# Patient Record
Sex: Female | Born: 1975 | Race: White | Hispanic: No | Marital: Married | Smoking: Former smoker
Health system: Southern US, Community
[De-identification: ages and names within clinical notes are randomized; demographics above are authoritative.]

## PROBLEM LIST (undated history)

## (undated) DIAGNOSIS — R112 Nausea with vomiting, unspecified: Secondary | ICD-10-CM

## (undated) DIAGNOSIS — N200 Calculus of kidney: Secondary | ICD-10-CM

## (undated) DIAGNOSIS — R51 Headache: Secondary | ICD-10-CM

## (undated) DIAGNOSIS — I00 Rheumatic fever without heart involvement: Secondary | ICD-10-CM

## (undated) DIAGNOSIS — Z9889 Other specified postprocedural states: Secondary | ICD-10-CM

## (undated) DIAGNOSIS — G43909 Migraine, unspecified, not intractable, without status migrainosus: Secondary | ICD-10-CM

## (undated) DIAGNOSIS — E785 Hyperlipidemia, unspecified: Secondary | ICD-10-CM

## (undated) DIAGNOSIS — K219 Gastro-esophageal reflux disease without esophagitis: Secondary | ICD-10-CM

## (undated) HISTORY — DX: Calculus of kidney: N20.0

## (undated) HISTORY — PX: OOPHORECTOMY: SHX86

## (undated) HISTORY — DX: Rheumatic fever without heart involvement: I00

## (undated) HISTORY — DX: Gastro-esophageal reflux disease without esophagitis: K21.9

## (undated) HISTORY — PX: OTHER SURGICAL HISTORY: SHX169

## (undated) HISTORY — PX: TONSILLECTOMY: SUR1361

## (undated) HISTORY — PX: CHOLECYSTECTOMY: SHX55

## (undated) HISTORY — PX: ABDOMINAL HYSTERECTOMY: SHX81

## (undated) HISTORY — DX: Hyperlipidemia, unspecified: E78.5

## (undated) HISTORY — PX: COLON SURGERY: SHX602

## (undated) HISTORY — DX: Migraine, unspecified, not intractable, without status migrainosus: G43.909

---

## 1994-01-14 HISTORY — PX: APPENDECTOMY: SHX54

## 2003-12-23 ENCOUNTER — Ambulatory Visit: Payer: Self-pay | Admitting: Internal Medicine

## 2004-04-30 ENCOUNTER — Ambulatory Visit: Payer: Self-pay | Admitting: Internal Medicine

## 2004-05-07 ENCOUNTER — Ambulatory Visit: Payer: Self-pay | Admitting: Internal Medicine

## 2004-05-25 ENCOUNTER — Ambulatory Visit: Payer: Self-pay | Admitting: Internal Medicine

## 2004-07-13 ENCOUNTER — Ambulatory Visit: Payer: Self-pay | Admitting: Internal Medicine

## 2004-12-31 ENCOUNTER — Ambulatory Visit: Payer: Self-pay | Admitting: Internal Medicine

## 2005-01-01 ENCOUNTER — Ambulatory Visit: Payer: Self-pay | Admitting: Internal Medicine

## 2005-01-09 ENCOUNTER — Ambulatory Visit (HOSPITAL_COMMUNITY): Admission: RE | Admit: 2005-01-09 | Discharge: 2005-01-09 | Payer: Self-pay | Admitting: Internal Medicine

## 2005-01-15 ENCOUNTER — Ambulatory Visit: Payer: Self-pay | Admitting: Internal Medicine

## 2005-01-16 ENCOUNTER — Ambulatory Visit: Payer: Self-pay | Admitting: Cardiology

## 2005-01-18 ENCOUNTER — Encounter (INDEPENDENT_AMBULATORY_CARE_PROVIDER_SITE_OTHER): Payer: Self-pay | Admitting: *Deleted

## 2005-01-18 ENCOUNTER — Inpatient Hospital Stay (HOSPITAL_COMMUNITY): Admission: EM | Admit: 2005-01-18 | Discharge: 2005-01-20 | Payer: Self-pay | Admitting: Surgery

## 2005-06-27 ENCOUNTER — Ambulatory Visit: Payer: Self-pay | Admitting: Internal Medicine

## 2005-07-01 ENCOUNTER — Other Ambulatory Visit: Admission: RE | Admit: 2005-07-01 | Discharge: 2005-07-01 | Payer: Self-pay | Admitting: Internal Medicine

## 2005-07-01 ENCOUNTER — Ambulatory Visit: Payer: Self-pay | Admitting: Internal Medicine

## 2005-08-27 ENCOUNTER — Ambulatory Visit: Payer: Self-pay | Admitting: Internal Medicine

## 2005-09-24 ENCOUNTER — Ambulatory Visit: Payer: Self-pay | Admitting: Internal Medicine

## 2005-11-26 ENCOUNTER — Ambulatory Visit: Payer: Self-pay | Admitting: Internal Medicine

## 2005-11-27 LAB — CONVERTED CEMR LAB
Basophils Relative: 1 % (ref 0.0–1.0)
HCT: 44.9 % (ref 36.0–46.0)
Hemoglobin: 15 g/dL (ref 12.0–15.0)
Monocytes Absolute: 0.3 10*3/uL (ref 0.2–0.7)
Neutrophils Relative %: 60.8 % (ref 43.0–77.0)
RBC: 4.77 M/uL (ref 3.87–5.11)
RDW: 12.3 % (ref 11.5–14.6)

## 2005-12-26 ENCOUNTER — Ambulatory Visit: Payer: Self-pay | Admitting: Internal Medicine

## 2006-04-08 ENCOUNTER — Ambulatory Visit: Payer: Self-pay | Admitting: Internal Medicine

## 2006-04-08 LAB — CONVERTED CEMR LAB
ALT: 25 units/L (ref 0–40)
Basophils Absolute: 0 10*3/uL (ref 0.0–0.1)
Bilirubin, Direct: 0.1 mg/dL (ref 0.0–0.3)
Eosinophils Absolute: 0.2 10*3/uL (ref 0.0–0.6)
Eosinophils Relative: 4.4 % (ref 0.0–5.0)
HCT: 40.6 % (ref 36.0–46.0)
Lymphocytes Relative: 37.8 % (ref 12.0–46.0)
MCHC: 34.2 g/dL (ref 30.0–36.0)
MCV: 92 fL (ref 78.0–100.0)
Neutro Abs: 2.9 10*3/uL (ref 1.4–7.7)
Neutrophils Relative %: 51.2 % (ref 43.0–77.0)
Platelets: 230 10*3/uL (ref 150–400)
Potassium: 3.7 meq/L (ref 3.5–5.1)
RBC: 4.41 M/uL (ref 3.87–5.11)
TSH: 1.18 microintl units/mL (ref 0.35–5.50)
Total Protein: 7.2 g/dL (ref 6.0–8.3)

## 2006-04-09 ENCOUNTER — Encounter: Payer: Self-pay | Admitting: Internal Medicine

## 2006-04-09 LAB — CONVERTED CEMR LAB: Prealbumin: 27.5 mg/dL (ref 18.0–45.0)

## 2006-05-05 ENCOUNTER — Ambulatory Visit: Payer: Self-pay | Admitting: Internal Medicine

## 2006-10-09 DIAGNOSIS — E785 Hyperlipidemia, unspecified: Secondary | ICD-10-CM | POA: Insufficient documentation

## 2006-10-09 DIAGNOSIS — I00 Rheumatic fever without heart involvement: Secondary | ICD-10-CM

## 2006-10-09 DIAGNOSIS — K219 Gastro-esophageal reflux disease without esophagitis: Secondary | ICD-10-CM | POA: Insufficient documentation

## 2006-10-09 HISTORY — DX: Gastro-esophageal reflux disease without esophagitis: K21.9

## 2006-10-09 HISTORY — DX: Rheumatic fever without heart involvement: I00

## 2006-10-09 HISTORY — DX: Hyperlipidemia, unspecified: E78.5

## 2006-10-28 DIAGNOSIS — F411 Generalized anxiety disorder: Secondary | ICD-10-CM | POA: Insufficient documentation

## 2006-10-28 DIAGNOSIS — F329 Major depressive disorder, single episode, unspecified: Secondary | ICD-10-CM | POA: Insufficient documentation

## 2006-10-28 DIAGNOSIS — F5000 Anorexia nervosa, unspecified: Secondary | ICD-10-CM | POA: Insufficient documentation

## 2006-12-10 IMAGING — NM NM HEPATO W/GB/PHARM/[PERSON_NAME]
2 series · 12 of 12 positions shown · non-contrast
Comparison: none

CLINICAL DATA: Abdominal pain and intolerance to fatty foods for six months.
 NUCLEAR MEDICINE HEPATOBILIARY SCAN WITH EJECTION FRACTION CALCULATION OF THE GALLBLADDER:
TECHNIQUE: Sequential abdominal images were obtained for approximately 60 minutes following intravenous injection of radiopharmaceutical.
 Radiopharmaceutical:  5 mCi Ac-QQm Choletec and 1.27 mcg of cholecystokinin.

[Series 1: he hepatobiliary · 3.21mm/px · 6 of 32 frames shown (1 of 2)]
[frame 3/32]
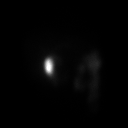
[frame 8/32]
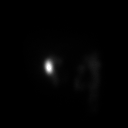
[frame 14/32]
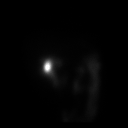
[frame 19/32]
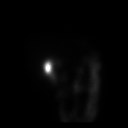
[frame 24/32]
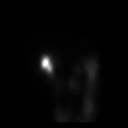
[frame 30/32]
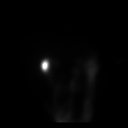

[Series 1: he hepatobiliary · 3.21mm/px · 6 of 60 frames shown (2 of 2)]
[frame 6/60]
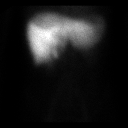
[frame 16/60]
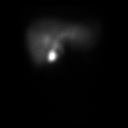
[frame 26/60]
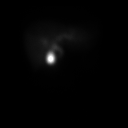
[frame 36/60]
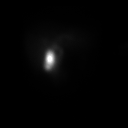
[frame 46/60]
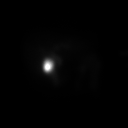
[frame 56/60]
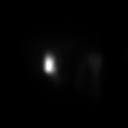

[12 of 12 positions shown; findings below may reference images not displayed]

FINDINGS: Uptake of radiopharmaceutical from the blood pool appears normal.  Gallbladder activity is visible at 8 minutes, and bowel activity is shown by 34 minutes. 
 Following administration of cholecystokinin, gallbladder ejection at 30 minutes is 63% which is within normal limits.
IMPRESSION: Normal hepatobiliary scan with normal gallbladder ejection demonstrated.

## 2006-12-17 IMAGING — CT CT ABDOMEN W/ CM
2 of 7 series · 14 of 46 positions shown, 18 images · IV contrast (APPLIED)
Comparison: Abdomen ultrasound report from [HOSPITAL], dated
01/01/2005. Nuclear medicine hepatobiliary scan dated 01/09/2005.

ABDOMEN CT WITH CONTRAST

<!--  IDXRADR:ADDEND:BEGIN -->Addendum Begins
<!--  IDXRADR:ADDEND:INNER_BEGIN -->Addendum:

In the clinical data, the partial colectomy was done for adhesions, not
effusions.
<!--  IDXRADR:ADDEND:INNER_END -->Addendum Ends
<!--  IDXRADR:ADDEND:END -->Clinical Data: Right upper quadrant abdominal pain radiating to the back for the
past 6 weeks, worse for the past 2 weeks. Nausea. Diarrhea. Status post total
abdominal hysterectomy, appendectomy and partial colectomy for effusions.
TECHNIQUE: Multidetector CT imaging of the abdomen and pelvis was performed
following the standard protocol during bolus administration of intravenous
contrast.
Contrast:  100 cc Omnipaque 300

[Series 2: abd_pel 5.0 b30f st · axial · 0.65mm/px · z∈[-436,-86]mm · 11 of 84 slices shown, 15 images]
[im 9/84  soft-tissue]
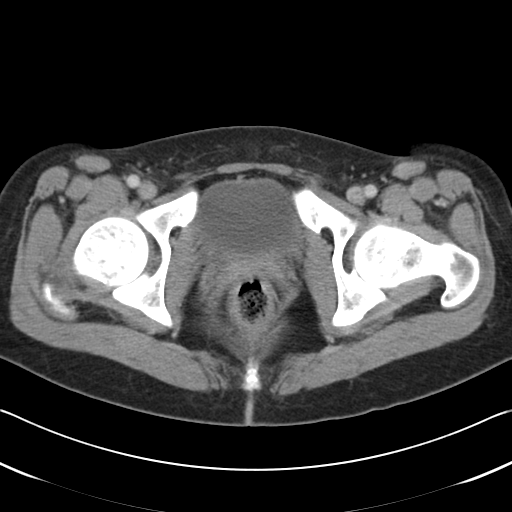
[im 9/84  bone]
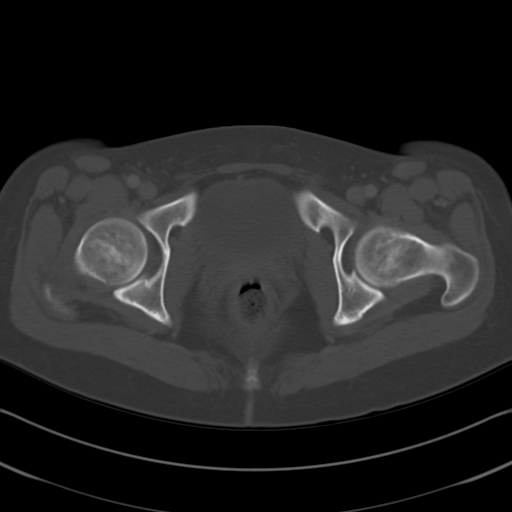
[im 18/84  soft-tissue]
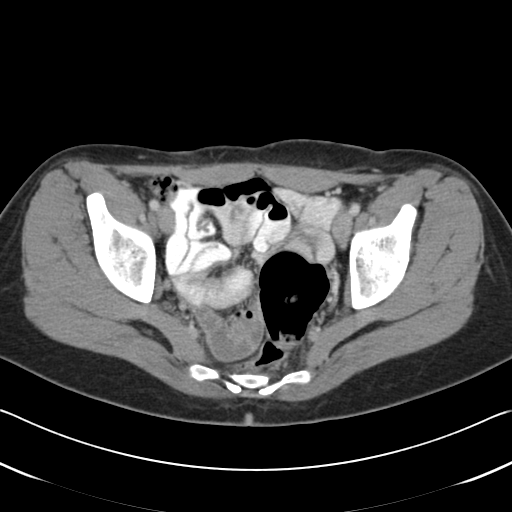
[im 27/84  soft-tissue]
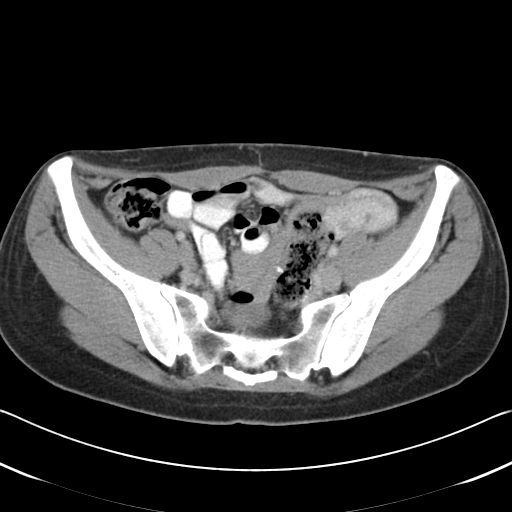
[im 35/84  soft-tissue]
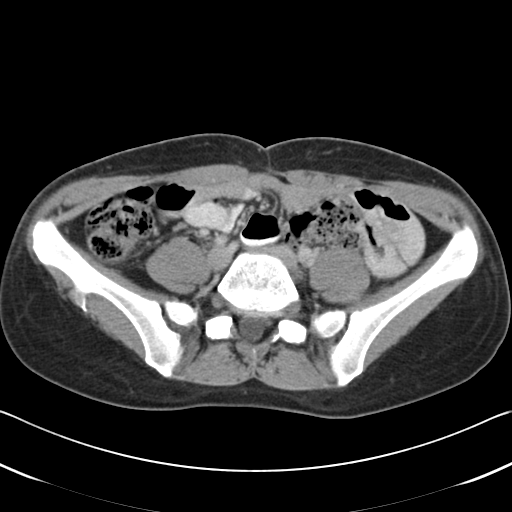
[im 44/84  soft-tissue]
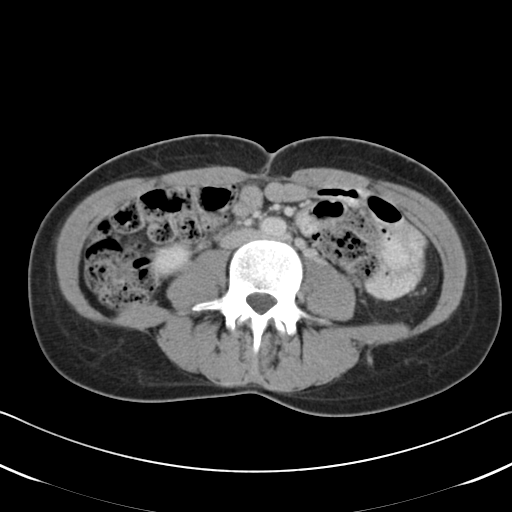
[im 53/84  soft-tissue]
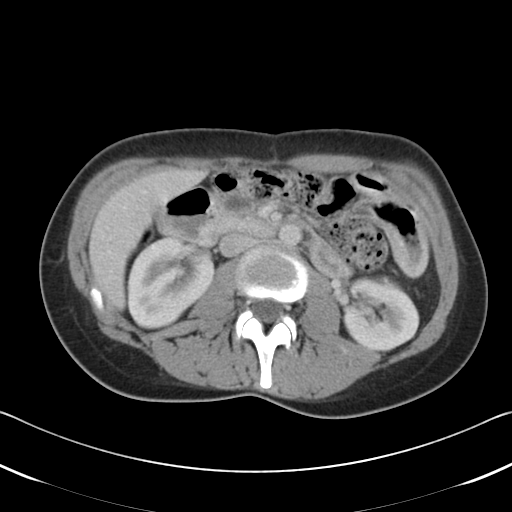
[im 62/84  soft-tissue]
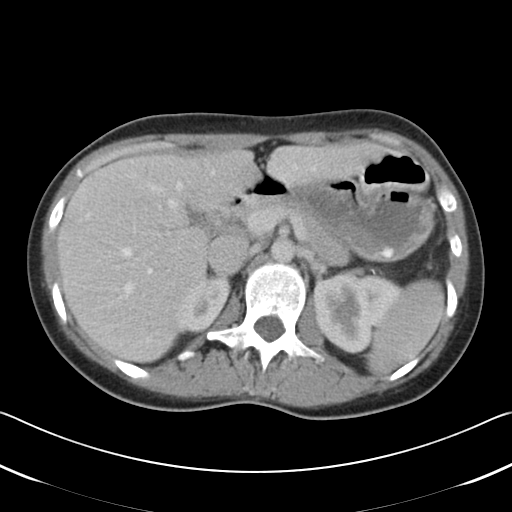
[im 66/84  lung]
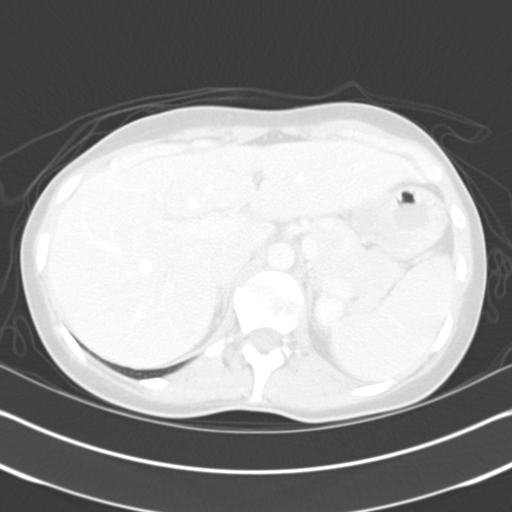
[im 70/84  soft-tissue]
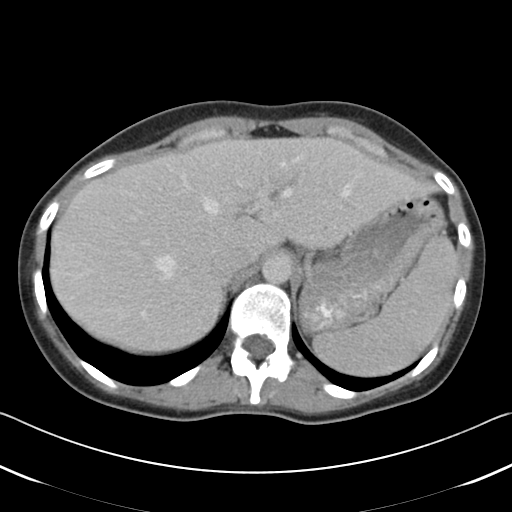
[im 70/84  lung]
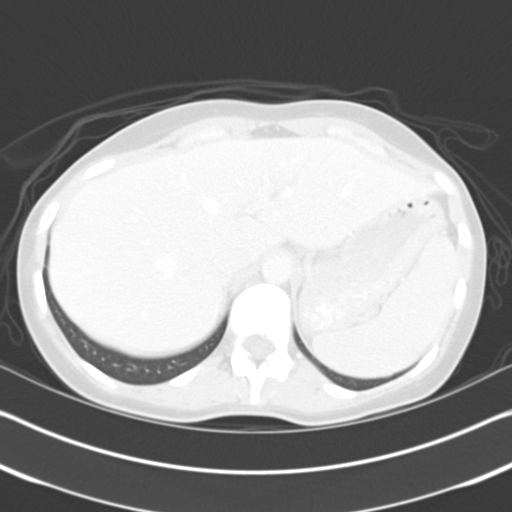
[im 75/84  lung]
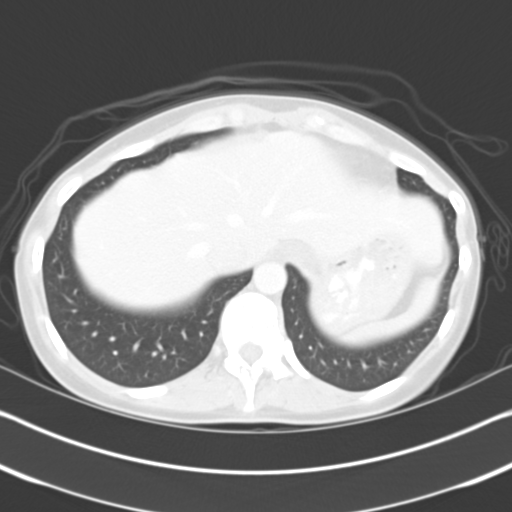
[im 79/84  soft-tissue]
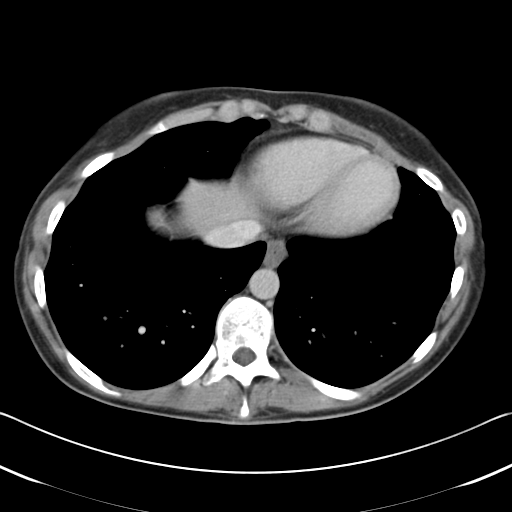
[im 79/84  lung]
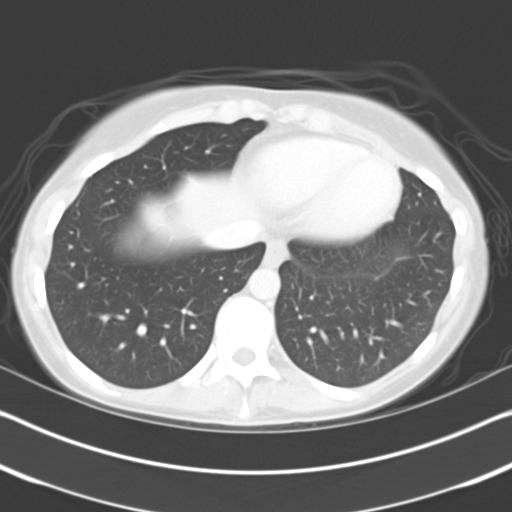
[im 79/84  bone]
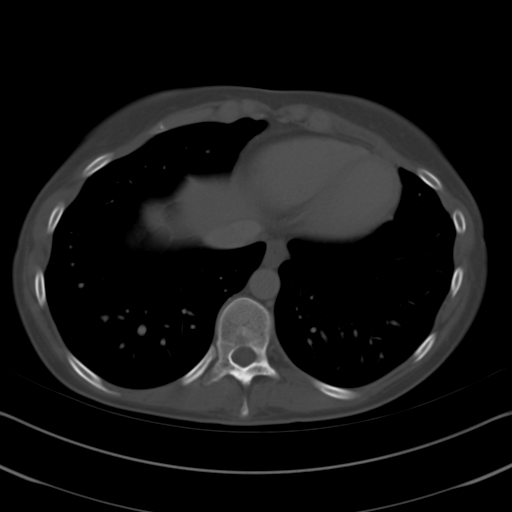

[Series 602: <mpr range> · coronal · 0.85mm/px · 3 of 39 slices shown]
[im 10/39  soft-tissue]
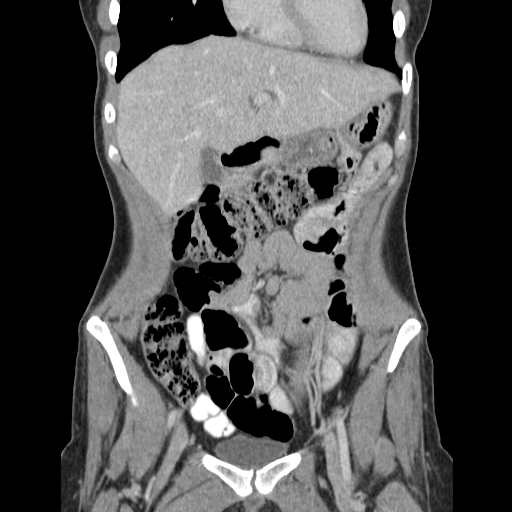
[im 20/39  soft-tissue]
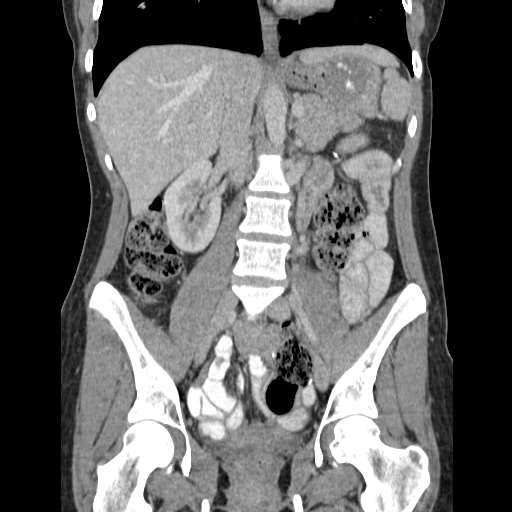
[im 29/39  soft-tissue]
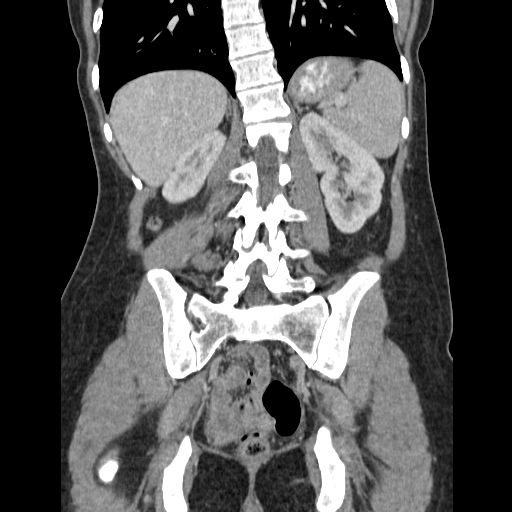

[14 of 46 positions shown; findings below may reference images not displayed]

FINDINGS: Mild diffuse gallbladder wall thickening, not seen at the time of the
previous ultrasound, by report. Suggestion of multiple small gallstones in the
neck of the gallbladder. No pericholecystic fluid seen. No biliary ductal
dilatation. Probable tiny cyst in the right lobe of the liver on image #16.
Unremarkable spleen, pancreas, kidneys and adrenal glands. Left upper quadrant
abdominal surgical clips, anterior and lateral to the left kidney. This is in
the region of the expected position of the splenic fracture of the colon. The
colon is located more inferiorly. There are additional surgical clips adjacent
to the left psoas muscle. No small bowel abnormalities or enlarged lymph nodes.
Clear lung bases. Mild to moderate levoconvex thoracolumbar scoliosis.

IMPRESSION

1. Mild diffuse gallbladder wall thickening. This may be an indication of
chronic or acute cholecystitis.

2. Suggestion of multiple small gallstones in the gallbladder. Since no stones
were seen in the gallbladder on the recent abdomen ultrasound, this most likely
represents volume averaging with the adjacent unopacified second portion of the
duodenum. There was no evidence of biliary obstruction on the recent
hepatobiliary scan.

PELVIS CT WITH CONTRAST
FINDINGS: Surgically absent uterus, ovaries and appendix. Surgical staple line
in sigmoid colon region, compatible with the location of previous partial
colectomy. No free peritoneal fluid or air. No enlarged lymph nodes. 2.3 cm
exostosis arising from the posterior aspect of the right iliac bone. No
overlying cartilage cap visualized.

IMPRESSION

No acute pelvic abnormality.

## 2006-12-20 IMAGING — RF DG CHOLANGIOGRAM OPERATIVE
1 series · 4 of 4 positions shown · non-contrast
Comparison: none

CLINICAL DATA: Cholelithiasis

[Series 1: run · 4 of 66 frames shown]
[frame 10/66]
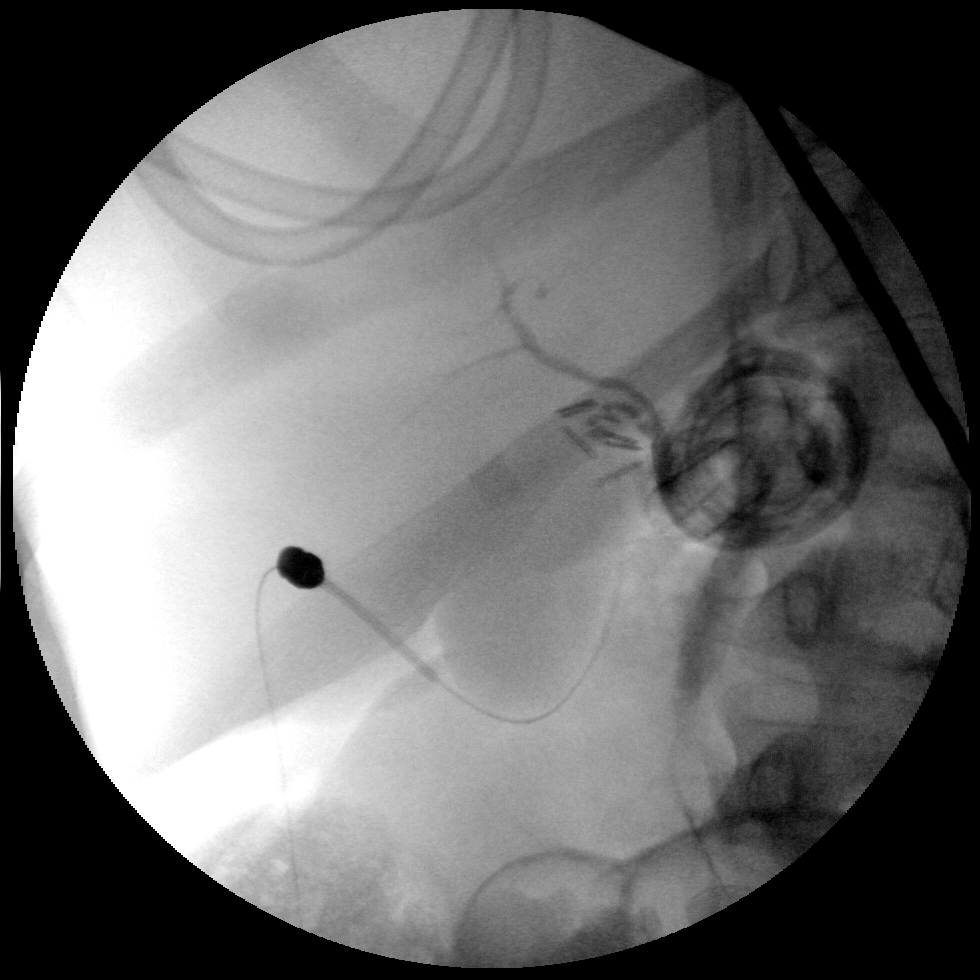
[frame 34/66]
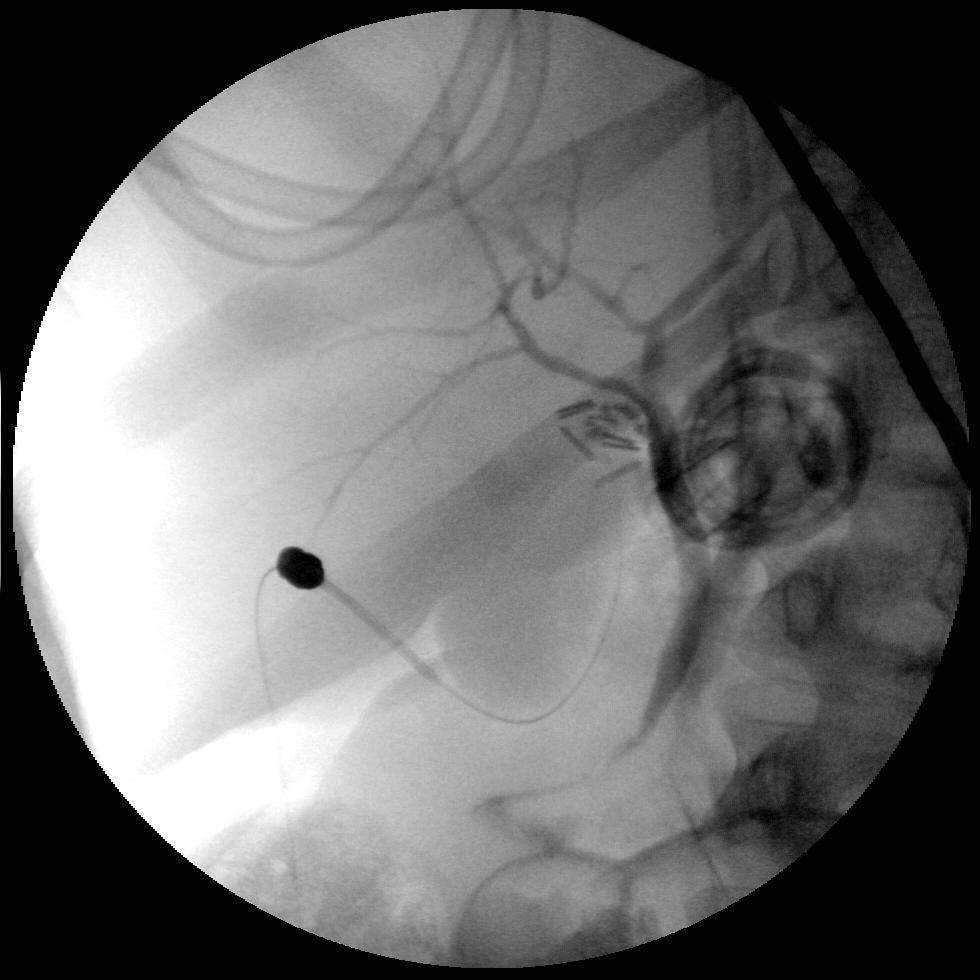
[frame 57/66]
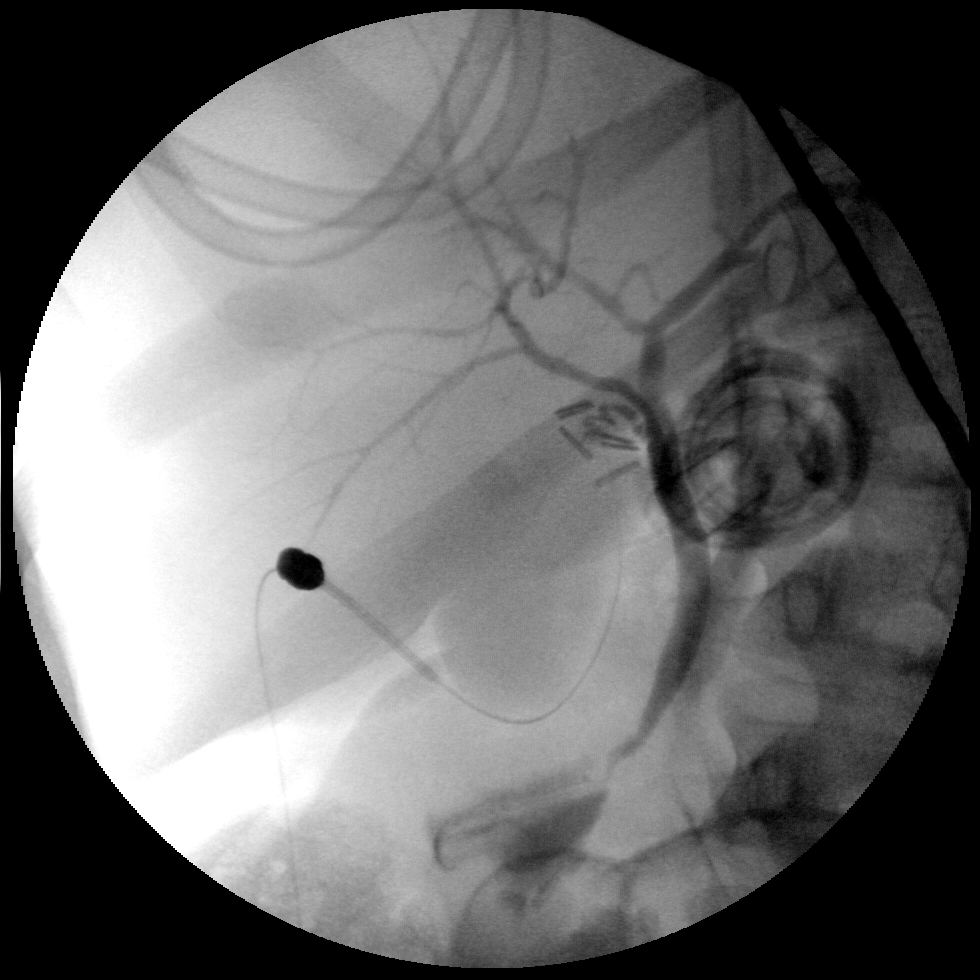
[frame 61/66]
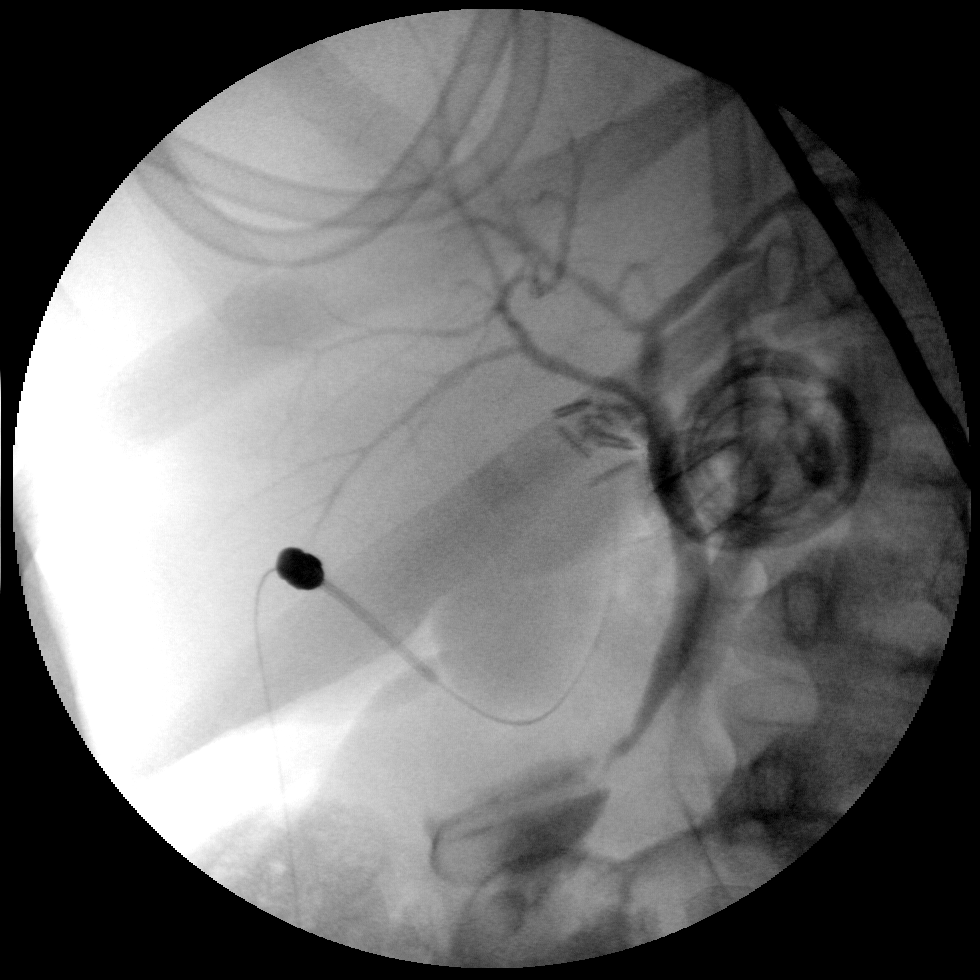

[4 of 4 positions shown; findings below may reference images not displayed]

INTRAOPERATIVE CHOLANGIOGRAM:

66  images from intraoperative C-arm fluoroscopy demonstrate  opacification of
the common bile duct. No filling defects to suggest retained stones. There is
incomplete evaluation of intrahepatic biliary tree, which appears decompressed
centrally. Contrast appears to flow on into decompressed duodenum.
IMPRESSION: 1. Negative for retained common duct stone

## 2008-02-12 ENCOUNTER — Encounter: Payer: Self-pay | Admitting: Internal Medicine

## 2008-02-17 ENCOUNTER — Encounter: Payer: Self-pay | Admitting: Internal Medicine

## 2009-11-27 ENCOUNTER — Ambulatory Visit: Payer: Self-pay | Admitting: Family Medicine

## 2009-11-27 DIAGNOSIS — E860 Dehydration: Secondary | ICD-10-CM | POA: Insufficient documentation

## 2009-11-27 DIAGNOSIS — G43909 Migraine, unspecified, not intractable, without status migrainosus: Secondary | ICD-10-CM

## 2009-11-27 DIAGNOSIS — R112 Nausea with vomiting, unspecified: Secondary | ICD-10-CM | POA: Insufficient documentation

## 2009-11-27 HISTORY — DX: Migraine, unspecified, not intractable, without status migrainosus: G43.909

## 2009-12-02 ENCOUNTER — Encounter: Payer: Self-pay | Admitting: Family Medicine

## 2009-12-04 ENCOUNTER — Telehealth: Payer: Self-pay | Admitting: Internal Medicine

## 2009-12-05 ENCOUNTER — Telehealth: Payer: Self-pay | Admitting: Family Medicine

## 2009-12-05 ENCOUNTER — Ambulatory Visit: Payer: Self-pay | Admitting: Family Medicine

## 2009-12-05 DIAGNOSIS — R5383 Other fatigue: Secondary | ICD-10-CM | POA: Insufficient documentation

## 2009-12-05 DIAGNOSIS — E876 Hypokalemia: Secondary | ICD-10-CM | POA: Insufficient documentation

## 2009-12-05 LAB — CONVERTED CEMR LAB
BUN: 8 mg/dL (ref 6–23)
Calcium: 10.6 mg/dL — ABNORMAL HIGH (ref 8.4–10.5)
Chloride: 104 meq/L (ref 96–112)
Creatinine, Ser: 0.7 mg/dL (ref 0.4–1.2)
GFR calc non Af Amer: 100.8 mL/min (ref >60)

## 2009-12-18 ENCOUNTER — Encounter: Payer: Self-pay | Admitting: Family Medicine

## 2009-12-21 ENCOUNTER — Telehealth: Payer: Self-pay | Admitting: Family Medicine

## 2010-01-27 ENCOUNTER — Encounter: Payer: Self-pay | Admitting: Family Medicine

## 2010-02-03 ENCOUNTER — Encounter: Payer: Self-pay | Admitting: Internal Medicine

## 2010-02-13 NOTE — Letter (Signed)
Summary: Out of Work  [facility] [employer] [facility] [employer]  [address]   [place], [place] [address]   Phone: [phone]  Fax: [phone]    December 05, 2009   Employee:  KINDAL PONTI    To Whom It May Concern:   For Medical reasons, please excuse the above named employee from work for the following dates:  Start:   12-02-09  End:   12-11-09  If you need additional information, please feel free to contact our office.         Sincerely,    Evelena Peat MD

## 2010-02-13 NOTE — Assessment & Plan Note (Signed)
Summary: migraines/nausea/vomitting/cjr   Vital Signs:  Patient profile:   35 year old female Menstrual status:  hysterectomy LMP:     11/15/1995 Height:      71 inches Weight:      134 pounds Temp:     98.7 degrees F oral BP sitting:   120 / 82  (left arm) Cuff size:   regular  Vitals Entered By: Sid Falcon LPN (November 27, 2009 2:40 PM)  History of Present Illness: Patient seen with headache which started about 2 days ago. She has headache which is mostly bifrontal possibly started worse on one side, throbbing quality, with some photosensitivity and nausea and vomiting. Is eating very little and kept down few fluids past couple of days. She tried Phenergan without much improvement. Tried Ultram, Excedrin Migraine, and Vicodin without relief. No history of head injury. No fever or chills. No sinusitis symptoms.  No real alleviating factors.  denies any focal weakness.  Allergies: 1)  ! Minocin (Minocycline Hcl)  Past History:  Past Medical History: Last updated: 10/09/2006 GERD Hyperlipidemia ? Rheumatic fever PMH reviewed for relevance  Social History: Occupation:  Engineer, civil (consulting) Divorced Occupation:  employed  Review of Systems       The patient complains of anorexia.  The patient denies fever, vision loss, decreased hearing, chest pain, and difficulty walking.    Physical Exam  General:  pt is alert but slightly ill appearing, cooperative throughout exam. Head:  Normocephalic and atraumatic without obvious abnormalities. No apparent alopecia or balding. Eyes:  No corneal or conjunctival inflammation noted. EOMI. Perrla. Funduscopic exam benign, without hemorrhages, exudates or papilledema. Vision grossly normal. Ears:  External ear exam shows no significant lesions or deformities.  Otoscopic examination reveals clear canals, tympanic membranes are intact bilaterally without bulging, retraction, inflammation or discharge. Hearing is grossly normal bilaterally. Mouth:   Oral mucosa and oropharynx without lesions or exudates.  Teeth in good repair. Neck:  No deformities, masses, or tenderness noted. supple. Lungs:  Normal respiratory effort, chest expands symmetrically. Lungs are clear to auscultation, no crackles or wheezes. Heart:  Normal rate and regular rhythm. S1 and S2 normal without gallop, murmur, click, rub or other extra sounds. Neurologic:  alert & oriented X3, cranial nerves II-XII intact, strength normal in all extremities, and gait normal.   Cervical Nodes:  No lymphadenopathy noted   Impression & Recommendations:  Problem # 1:  MIGRAINE HEADACHE (ICD-346.90) headache sounds more vascular-throbbing, nausea, severity, photosensitive.  She did not have anyone with her and unable to get her friend on phone so unable to use IM, potentially sedating, meds.  She will consider return when not acute to discuss possible acute treatments if this recurs. The following medications were removed from the medication list:    Nadolol 20 Mg Tabs (Nadolol) .Marland Kitchen... 1/2 once daily Her updated medication list for this problem includes:    Oxycodone-acetaminophen 5-325 Mg Tabs (Oxycodone-acetaminophen) .Marland Kitchen... 1-2 by mouth q 6 hours as needed severe headache  Problem # 2:  NAUSEA WITH VOMITING (ICD-787.01) Pt has phenergan at home.  No IM meds for above reason.  Problem # 3:  DEHYDRATION (ICD-276.51) We attempted IV but unable to given.  We had hoped to given 1 liter NS.   Discussed with pt and she will be going to new [facility] for IV fluids and antiemetics.  Complete Medication List: 1)  One-a-day Womens Formula Tabs (Multiple vitamins-calcium) .... Once daily 2)  Oxycodone-acetaminophen 5-325 Mg Tabs (Oxycodone-acetaminophen) .Marland Kitchen.. 1-2 by mouth q  6 hours as needed severe headache Prescriptions: OXYCODONE-ACETAMINOPHEN 5-325 MG TABS (OXYCODONE-ACETAMINOPHEN) 1-2 by mouth q 6 hours as needed severe headache  #15 x 0   Entered and Authorized by:   Evelena Peat MD   Signed by:   Evelena Peat MD on 11/27/2009   Method used:   Print then Give to Patient   RxID:   [id number]    Orders Added: 1)  Est. Patient Level IV [62952]   Immunization History:  Influenza Immunization History:    Influenza:  historical (11/22/2009)   Immunization History:  Influenza Immunization History:    Influenza:  Historical (11/22/2009)  Preventive Care Screening  Last Tetanus Booster:    Date:  11/15/2002    Results:  Tdap

## 2010-02-13 NOTE — Letter (Signed)
Summary: [facility]   Imported By: Maryln Gottron 12/06/2009 15:43:57  _____________________________________________________________________  External Attachment:    Type:   Image     Comment:   External Document

## 2010-02-13 NOTE — Progress Notes (Signed)
  Phone Note Call from Patient Call back at [facility] Phone [phone]   Caller: Patient Call For: Evelena Peat MD Summary of Call: Migraines are still not under control.  Is asking for a referral to [facility]. Initial call taken by: [facility] CMA AAMA,  December 21, 2009 12:22 PM  Follow-up for Phone Call        She had follow up with neurologist after hosp d/c.  Did she go? She could try titration of nortryptyline to 50 mg by mouth at bedtime. We have many other meds we could try for prophylaxis but would try the above first.  Does she have specific neurologist in mind? Follow-up by: Evelena Peat MD,  December 21, 2009 1:03 PM  Additional Follow-up for Phone Call Additional follow up Details #1::        [facility] Additional Follow-up by: [facility] CMA AAMA,  December 21, 2009 2:29 PM    Additional Follow-up for Phone Call Additional follow up Details #2::    She does want to see the neurologist she saw after her hospitalization and does not care who she sees at [facility], but wants the earliest appt. Follow-up by: Lynann Beaver CMA AAMA,  December 21, 2009 3:07 PM  Additional Follow-up for Phone Call Additional follow up Details #3:: Details for Additional Follow-up Action Taken: OK to set up with neurologist at [facility]. Additional Follow-up by: Evelena Peat MD,  December 21, 2009 5:14 PM

## 2010-02-13 NOTE — Progress Notes (Signed)
Summary: REQ FOR F/U APPT (POST HOSP F/U)  Phone Note Call from Patient   Caller: Patient    [phone] Summary of Call: Pt called to adv that she has just been d/c from [facility]  (dx: migraines)..... Pt adv that she is supposed to f/u up with her PCP before she goes back to work.... Can you advise a date/time that pt can come in for f/u?    Pt adv that she was seen last by Dr Caryl Never (Dr Lovell Sheehan was not available)... Wants to know if she should f/u with Dr Caryl Never since he saw her for reason that she was sent to hospital (migraines)?  Initial call taken by: Debbra Riding,  December 04, 2009 10:51 AM  Follow-up for Phone Call        talked with pt and she want to see dr burchett Follow-up by: Willy Eddy, LPN,  December 04, 2009 10:59 AM

## 2010-02-13 NOTE — Assessment & Plan Note (Signed)
Summary: hosp fup per bonnye//[facility]m   Vital Signs:  Patient profile:   35 year old female Menstrual status:  hysterectomy Height:      71 inches Weight:      137 pounds Temp:     98.4 degrees F oral BP sitting:   124 / 94  (left arm) Cuff size:   regular  Vitals Entered By: Sid Falcon LPN (December 05, 2009 10:52 AM)  History of Present Illness: Patient seen for hospital followup.  She had presented here with severe headache which we thought was probably migraine related. She was dehydrated. We attempted IV unsuccessfully. Patient subsequently went to new [facility] and had IV fluids and lumbar puncture. Initial lumbar puncture came back with question of fungal elements. She was then called back for admission the following day after not seeing improvement in symptoms.  Patient had repeat spinal tap which apparently did not show any evidence for fungal elements. She had consultation with infectious disease. She had multiple testing including sed rate, CRP which were normal. VDRL nonreactive. Cryptococcal antigen, HSV, CMV, and fungal assays normal. Lyme antibodies negative. Influenza A negative. CSF cultures negative. Blood cultures remain negative. CT of the head and MRI brain no acute abnormality.  Patient was treated initially with doxycycline, Rocephin, and to continued between 11-15 and 11-19 2011. Infectious disease recommended discontinuing ceftriaxone and continuing 10 day course of doxycycline.  For headache she received DHEA along with Toradol and trazodone with some improvement. She was discharged on Naprosyn and Zofran.  She still has some poorly localized headache at this time occipital to frontal, though greatly improved from hospital.  Headache is dull.  Feels fatigued and poor appetite. No further vomiting.  Pt also had reported hypokalemia during hosp requiring IV replacement.  Past History:  Past Medical History: Last updated:  10/09/2006 GERD Hyperlipidemia ? Rheumatic fever  Past Surgical History: Last updated: 10/09/2006 Appendectomy  1996 Caesarean section Cholecystectomy Hemorrhoidectomy--scar tissue Hysterectomy  DUB after childbirth, endometriosis Oophorectomy  Social History: Last updated: 11/27/2009 Occupation:  nurse Divorced PMH-FH-SH reviewed for relevance  Review of Systems       The patient complains of anorexia, weight loss, and headaches.  The patient denies fever, vision loss, decreased hearing, chest pain, syncope, dyspnea on exertion, peripheral edema, prolonged cough, and difficulty walking.    Physical Exam  General:  Well-developed,well-nourished,in no acute distress; alert,appropriate and cooperative throughout examination Head:  Normocephalic and atraumatic without obvious abnormalities. No apparent alopecia or balding. Eyes:  No corneal or conjunctival inflammation noted. EOMI. Perrla. Funduscopic exam benign, without hemorrhages, exudates or papilledema. Vision grossly normal. Ears:  External ear exam shows no significant lesions or deformities.  Otoscopic examination reveals clear canals, tympanic membranes are intact bilaterally without bulging, retraction, inflammation or discharge. Hearing is grossly normal bilaterally. Mouth:  Oral mucosa and oropharynx without lesions or exudates.  Teeth in good repair. Neck:  No deformities, masses, or tenderness noted. Lungs:  Normal respiratory effort, chest expands symmetrically. Lungs are clear to auscultation, no crackles or wheezes. Heart:  normal rate and regular rhythm.   Extremities:  No clubbing, cyanosis, edema, or deformity noted with normal full range of motion of all joints.   Neurologic:  alert & oriented X3, cranial nerves II-XII intact, strength normal in all extremities, sensation intact to light touch, and gait normal.   Skin:  Intact without suspicious lesions or rashes Cervical Nodes:  No lymphadenopathy  noted Psych:  normally interactive, good eye contact, not anxious appearing,  and not depressed appearing.     Impression & Recommendations:  Problem # 1:  MIGRAINE HEADACHE (ICD-346.90) At this point she has some residual headache, ?tension component.  Given duration and poor sleep quality, trial of Nortryptyline 25 mg at bedtime which can hopefully help with propylaxis. Follow up with neurology as scheduled. The following medications were removed from the medication list:    Oxycodone-acetaminophen 5-325 Mg Tabs (Oxycodone-acetaminophen) .Marland Kitchen... 1-2 by mouth q 6 hours as needed severe headache  Problem # 2:  FATIGUE (ICD-780.79)  Problem # 3:  GERD (ICD-530.81)  Problem # 4:  HYPOKALEMIA (ICD-276.8)  Orders: TLB-BMP (Basic Metabolic Panel-BMET) (80048-METABOL) Venipuncture (19379) Specimen Handling (02409)  Complete Medication List: 1)  One-a-day Womens Formula Tabs (Multiple vitamins-calcium) .... Once daily 2)  Doxycycline Hyclate 100 Mg Caps (Doxycycline hyclate) .... By mouth two times a day 3)  Nortriptyline Hcl 25 Mg Caps (Nortriptyline hcl) .... One by mouth at bedtime 4)  Prochlorperazine Maleate 10 Mg Tabs (Prochlorperazine maleate) .... One every 8 hours as needed nausea and vomiting  Patient Instructions: 1)  Follow up immediately for any fever or worsening headache. Prescriptions: PROCHLORPERAZINE MALEATE 10 MG TABS (PROCHLORPERAZINE MALEATE) one every 8 hours as needed nausea and vomiting  #20 x 0   Entered by:   Sid Falcon LPN   Authorized by:   Evelena Peat MD   Signed by:   Sid Falcon LPN on 73/53/2992   Method used:   Electronically to        [facility]* (retail)       [address] [place], [place]  [address]       Ph: [phone]       Fax: [phone]   RxID:   [id number] NORTRIPTYLINE HCL 25 MG CAPS (NORTRIPTYLINE HCL) one by mouth at bedtime  #30 x 1   Entered and Authorized by:   Evelena Peat MD   Signed by:   Evelena Peat MD on 12/05/2009   Method used:   Electronically to        [facility]* (retail)       [address] [place], [place]  [address]       Ph: [phone]       Fax: [phone]   RxID:   [id number]    Orders Added: 1)  TLB-BMP (Basic Metabolic Panel-BMET) [80048-METABOL] 2)  Venipuncture [54650] 3)  Specimen Handling [99000] 4)  Est. Patient Level IV [35465]

## 2010-02-13 NOTE — Progress Notes (Signed)
Summary: Transfer of care request to Dr Caryl Never  Phone Note Call from Patient   Call For: Stacie Glaze MD/Burchette Summary of Call: Pt here for hospital follow-up, wals also seen by Dr Caryl Never last week.  Pt requesting transfer of care to Dr Caryl Never. Initial call taken by: Sid Falcon LPN,  December 05, 2009 11:00 AM  Follow-up for Phone Call        ok with dr Lovell Sheehan Follow-up by: Willy Eddy, LPN,  December 05, 2009 11:04 AM  Additional Follow-up for Phone Call Additional follow up Details #1::        OK with me Additional Follow-up by: Evelena Peat MD,  December 05, 2009 12:47 PM

## 2010-02-15 NOTE — Consult Note (Signed)
Summary: [facility]  [facility]   Imported By: Maryln Gottron 12/28/2009 11:12:55  _____________________________________________________________________  External Attachment:    Type:   Image     Comment:   External Document

## 2010-02-21 NOTE — Letter (Signed)
Summary: Appointments, Instructions, Medications/[facility]  Appointments, Instructions, Medications/[facility]   Imported By: Maryln Gottron 02/13/2010 12:56:07  _____________________________________________________________________  External Attachment:    Type:   Image     Comment:   External Document

## 2010-03-22 ENCOUNTER — Encounter: Payer: Self-pay | Admitting: Family Medicine

## 2010-03-23 ENCOUNTER — Ambulatory Visit: Payer: Self-pay | Admitting: Family Medicine

## 2010-04-04 ENCOUNTER — Other Ambulatory Visit: Payer: Self-pay | Admitting: *Deleted

## 2010-04-04 NOTE — Telephone Encounter (Signed)
Left message to call for appt

## 2010-04-04 NOTE — Telephone Encounter (Signed)
Terrible insomnia, and needs RX.  It is triggering her migraines also.  Hasn't slept in weeks.

## 2010-04-04 NOTE — Telephone Encounter (Signed)
Needs follow up to discuss options.   

## 2010-04-05 ENCOUNTER — Encounter: Payer: Self-pay | Admitting: Family Medicine

## 2010-04-09 ENCOUNTER — Telehealth: Payer: Self-pay | Admitting: *Deleted

## 2010-04-09 ENCOUNTER — Ambulatory Visit: Payer: Self-pay | Admitting: Family Medicine

## 2010-04-09 DIAGNOSIS — Z0289 Encounter for other administrative examinations: Secondary | ICD-10-CM

## 2010-04-09 NOTE — Telephone Encounter (Signed)
Pt no show for OV today, follow-up insomnia. Msg left on pt cell phone

## 2010-04-09 NOTE — Telephone Encounter (Signed)
I guess we should see if she has response regarding no show.  If not, may submit no show charge.

## 2010-05-23 ENCOUNTER — Encounter: Payer: Self-pay | Admitting: Family Medicine

## 2010-05-23 ENCOUNTER — Ambulatory Visit (INDEPENDENT_AMBULATORY_CARE_PROVIDER_SITE_OTHER): Payer: 59 | Admitting: Family Medicine

## 2010-05-23 DIAGNOSIS — J209 Acute bronchitis, unspecified: Secondary | ICD-10-CM

## 2010-05-23 DIAGNOSIS — F17200 Nicotine dependence, unspecified, uncomplicated: Secondary | ICD-10-CM

## 2010-05-23 DIAGNOSIS — Z559 Problems related to education and literacy, unspecified: Secondary | ICD-10-CM

## 2010-05-23 DIAGNOSIS — G43909 Migraine, unspecified, not intractable, without status migrainosus: Secondary | ICD-10-CM

## 2010-05-23 MED ORDER — AZITHROMYCIN 250 MG PO TABS: 250.0000 mg | ORAL_TABLET | Freq: Every day | ORAL | Status: AC

## 2010-05-23 MED ORDER — VARENICLINE TARTRATE 0.5 MG X 11 & 1 MG X 42 PO MISC: ORAL | Status: AC

## 2010-05-23 MED ORDER — HYDROCODONE-HOMATROPINE 5-1.5 MG/5ML PO SYRP: 5.0000 mL | ORAL_SOLUTION | Freq: Four times a day (QID) | ORAL | Status: AC | PRN

## 2010-05-23 MED ORDER — VARENICLINE TARTRATE 1 MG PO TABS: 1.0000 mg | ORAL_TABLET | Freq: Two times a day (BID) | ORAL | Status: AC

## 2010-05-23 NOTE — Progress Notes (Signed)
  Subjective:    Patient ID: Laura Henson, female    DOB: September 11, 1975, 35 y.o.   MRN: [id number]  HPI Patient seen for the following  Acute illness with left earache, fever to 102 last night, night sweats, sore throat and productive cough over the past 4-5 days. Fever just started last night. No nausea, vomiting, or diarrhea. She does smoke. Cough seems to be getting worse. No relief with Robitussin. Requesting cough medication.  History of migraine headaches. She is seeing neurologist. These have been very resistant to treat. She has received prevention with Neurontin which seems to working fairly well. She's tried Imitrex injectable with some relief.  Would like to try another oral tryptan.  Currently smokes about one half pack cigarettes per day.  Has previously tried Chantix and would like to try this again. No prior history side effects. No history of depression issues.  Past Medical History  Diagnosis Date  . MIGRAINE HEADACHE 11/27/2009  . 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 GERD 10/09/2006  . HYPERLIPIDEMIA 10/09/2006   Past Surgical History  Procedure Date  . Appendectomy 1996  . Cesarean section   . Cholecystectomy   . Hemoroidectomy     scar tissue  . Abdominal hysterectomy     DUB after childbirth, endometriosis  . Oophorectomy     reports that she has been smoking Cigarettes.  She has a 3 pack-year smoking history. She does not have any smokeless tobacco history on file. Her alcohol and drug histories not on file. family history is not on file. No Known Allergies    Review of Systems  Constitutional: Positive for fever, chills and fatigue. Negative for activity change and appetite change.  HENT: Positive for ear pain and sore throat. Negative for hearing loss and neck stiffness.   Respiratory: Positive for cough. Negative for shortness of breath and wheezing.   Cardiovascular: Negative for chest pain.  Neurological: Negative for dizziness and headaches.       Objective:    Physical Exam  Constitutional: She appears well-developed and well-nourished. No distress.  HENT:  Right Ear: External ear normal.  Left Ear: External ear normal.  Mouth/Throat: Oropharynx is clear and moist. No oropharyngeal exudate.  Eyes: Pupils are equal, round, and reactive to light.  Neck: Neck supple. No thyromegaly present.  Cardiovascular: Normal rate, regular rhythm and normal heart sounds.   No murmur heard. Pulmonary/Chest: Effort normal and breath sounds normal.       Some rhonchi noted left base. No rales. No wheezes. No retractions.  Musculoskeletal: She exhibits no edema.  Lymphadenopathy:    She has no cervical adenopathy.  Skin: No rash noted.          Assessment & Plan:  #1 acute bronchitis. Start Zithromax given worsening of symptoms. Hycodan for cough suppression at night #2 migraine headaches. Trial of Relpax 40 mg and may repeat once in 2 hours as needed #3 smoking cessation discussed. Try Chantix-instructions given.

## 2010-05-23 NOTE — Patient Instructions (Signed)
Relpax one at onset of headache and may repeat once in 2 hours as needed.

## 2010-06-01 NOTE — Op Note (Signed)
Laura Henson, Laura Henson                 ACCOUNT NO.:  [id number]   MEDICAL RECORD NO.:  [id number]          PATIENT TYPE:  INP   LOCATION:  1605                         FACILITY:  [facility]   PHYSICIAN:  Wilmon Arms. Corliss Skains, M.D. DATE OF BIRTH:  October 16, 1975   DATE OF PROCEDURE:  01/19/2005  DATE OF DISCHARGE:  01/20/2005                                 OPERATIVE REPORT   PREOPERATIVE DIAGNOSES:  Acute cholecystitis.   POSTOPERATIVE DIAGNOSES:  Acute cholecystitis.   PROCEDURE:  Laparoscopic cholecystectomy with intraoperative cholangiogram.   SURGEON:  Wilmon Arms. Corliss Skains, M.D.   ASSISTANT:  Sandria Bales. Ezzard Standing, M.D.   ANESTHESIA:  General endotracheal.   INDICATIONS FOR PROCEDURE:  The patient is a 35 year old female with severe  right upper quadrant pain over the last several months that has gradually  worsened. She underwent a workup which included an ultrasound which was read  as normal. A HIDA scan was also read as normal. However, a CT scan 2 days  ago showed wall thickened. There was a suggestion of multiple small  gallstones. The patient presented to the office yesterday and was extremely  ill appearing. She was directly admitted and placed on intravenous  antibiotics and was planned for gallbladder removal today. She has had  multiple previous surgeries which may complicated her case.   DESCRIPTION OF PROCEDURE:  The patient was brought to the operating room and  placed in the supine position on the operating room table. After an adequate  level of general endotracheal anesthesia was obtained, a Foley catheter was  placed in a sterile technique. The patient's abdomen was prepped with  Betadine and draped in sterile fashion. A time out was taken to ensure the  proper patient, proper procedure, We began with a small vertical midline  incision in the subxiphoid region. Dissection was carried down to the fascia  which was opened vertically. A small opening was created in the peritoneum.  Finger palpation determined that this was the falciform ligament and there  were no other adhesions. A stay suture of #0 Vicryl was placed and the  Hasson cannula was inserted and secured with stay suture. Pneumoperitoneum  was obtained by insufflation CO2, maintaining maximum pressure of 15 mmHg.  Two 5 mm ports were placed in the right upper quadrant under direct vision.  We then placed a 10 mm port just below the umbilicus to the right of  midline. There were multiple adhesions of small bowel to the anterior  abdominal wall near the umbilicus which is why we chose to go off the  midline. We then moved the scope to the periumbilical port and proceed with  a standard laparoscopic cholecystectomy. The gallbladder was grasped with  clamps and elevated. The peritoneum was opened around the hilum of the  gallbladder. Two small blood vessels were seen leading to the gallbladder.  These were circumferentially dissected and ligated with clips and divided.  The cystic duct was circumferentially dissected. A clip was placed distally  and a small opening was created on the cystic duct. A Reddick cholangiogram  catheter was inserted through an  Angiocath and I threaded it into the cystic  duct. The balloon was inflated and good flow was seen. A fluoro  cholangiogram was then obtained which showed good flow proximally and  distally in the biliary tree down into the duodenum with no evidence of  filling defect. The catheter was then removed, the cystic duct was ligated  with clips and divided. Cautery was then used to remove the gallbladder from  the liver bed. The gallbladder was placed in an EndoCatch sac and removed  through the subxiphoid port. We reinspected the liver bed and no bleeding  was noted. The irrigant was suctioned out. The subxiphoid opening was closed  with the pursestring suture as well as an additional figure-of-eight #0  Vicryl. Pneumoperitoneum was released and the remainder of the  ports were  removed. 4-0 Monocryl was used to close the skin a subcuticular fashion. All  of the ports had been infiltrated with 0.25% Marcaine. Steri-Strips and  clean dressings were applied. The patient was extubated and brought to the  recovery room in stable condition.      Wilmon Arms. Tsuei, M.D.  Electronically Signed     MKT/MEDQ  D:  01/19/2005  T:  01/21/2005  Job:  [id number]   cc:   Valetta Mole. Swords, M.D. [facility]  [address] [place]  [place] [address]

## 2010-06-06 ENCOUNTER — Telehealth: Payer: Self-pay | Admitting: *Deleted

## 2010-06-06 NOTE — Telephone Encounter (Signed)
Pt would like Relpax sent to [facility] Kathryne Sharper), please.

## 2010-06-06 NOTE — Telephone Encounter (Signed)
Refill Relpax 40 mg one at onset of migraine and may repeat one in 2 hours-max 2 in 24 hours.

## 2010-06-07 MED ORDER — ELETRIPTAN HYDROBROMIDE 40 MG PO TABS: ORAL_TABLET | ORAL | Status: DC

## 2010-06-07 NOTE — Telephone Encounter (Signed)
Rx faxed, confirmation received.

## 2010-06-18 ENCOUNTER — Ambulatory Visit (INDEPENDENT_AMBULATORY_CARE_PROVIDER_SITE_OTHER): Payer: 59 | Admitting: Family Medicine

## 2010-06-18 ENCOUNTER — Encounter: Payer: Self-pay | Admitting: Family Medicine

## 2010-06-18 VITALS — BP 130/84 | Temp 98.6°F | Ht 71.0 in | Wt 142.0 lb

## 2010-06-18 DIAGNOSIS — M2550 Pain in unspecified joint: Secondary | ICD-10-CM

## 2010-06-18 DIAGNOSIS — R5381 Other malaise: Secondary | ICD-10-CM

## 2010-06-18 DIAGNOSIS — G43909 Migraine, unspecified, not intractable, without status migrainosus: Secondary | ICD-10-CM

## 2010-06-18 DIAGNOSIS — R5383 Other fatigue: Secondary | ICD-10-CM

## 2010-06-18 LAB — POCT URINALYSIS DIPSTICK
Bilirubin, UA: NEGATIVE
Glucose, UA: NEGATIVE
Nitrite, UA: NEGATIVE
Spec Grav, UA: 1.005
Urobilinogen, UA: 0.2

## 2010-06-18 NOTE — Progress Notes (Signed)
  Subjective:    Patient ID: Laura Henson, female    DOB: [birth year], 35 y.o.   MRN: [id number]  HPI Patient presents with chief complaint of fatigue and arthralgias. Symptoms for approximately 2 months. Questionable intermittent low-grade fever. Temperature only taken around 99.5 (no true fever documented). Occasional mild sweats. Patient complains of pain involving hands mostly MCP and PIP joints, elbows, and knees. Early morning stiffness. Better after movement. Pain persists through the day.  Patient denies any rash, focal abdominal pain, bloody stools, recent tick bites. No recent cough. Does have some mild dyspnea with activity. Slight decrease in appetite and minimal weight loss recently of a few pounds. No family history of inflammatory arthritis.  Patient had extreme fatigue over the past couple months and progressive. No changes in sleep. No changes in diet.  Patient reportedly had rheumatic fever age 73 but no recent murmur. No history of known mitral stenosis.  Still smokes. Tried Chantix several weeks ago but had side effects   Review of Systems  Constitutional: Positive for fever, appetite change and fatigue. Negative for activity change.  HENT: Negative for congestion, sore throat, voice change and sinus pressure.   Respiratory: Negative for cough and wheezing.   Cardiovascular: Negative for palpitations and leg swelling.  Gastrointestinal: Negative for nausea, vomiting, abdominal pain, blood in stool, abdominal distention and anal bleeding.  Genitourinary: Negative for dysuria and flank pain.  Musculoskeletal: Positive for arthralgias. Negative for myalgias.  Skin: Negative for rash.  Neurological: Positive for weakness and headaches.  Hematological: Negative for adenopathy. Does not bruise/bleed easily.  Psychiatric/Behavioral: Negative for dysphoric mood.       Objective:   Physical Exam  Constitutional: She appears well-developed and well-nourished. No distress.    HENT:  Right Ear: External ear normal.  Left Ear: External ear normal.  Mouth/Throat: Oropharynx is clear and moist. No oropharyngeal exudate.  Eyes: Pupils are equal, round, and reactive to light.  Neck: Neck supple. No thyromegaly present.  Cardiovascular: Normal rate, regular rhythm and normal heart sounds.   No murmur heard. Pulmonary/Chest: Effort normal and breath sounds normal. No respiratory distress. She has no wheezes. She has no rales.  Abdominal: Soft. She exhibits no distension and no mass. There is no tenderness. There is no rebound and no guarding.  Musculoskeletal: She exhibits no edema.       No evidence for any joint erythema or warmth  Lymphadenopathy:    She has no cervical adenopathy.  Skin: No rash noted.  Psychiatric: She has a normal mood and affect.          Assessment & Plan:  #1 progressive arthralgias and fatigue. She presents with nonspecific symptoms of fatigue and arthralgias. No evidence for active inflammatory changes on exam today Start with screening lab work. If unrevealing if symptoms persist consider referral to rheumatologist

## 2010-06-19 LAB — BASIC METABOLIC PANEL
Calcium: 9.7 mg/dL (ref 8.4–10.5)
GFR: 105.71 mL/min (ref >60.00)
Potassium: 3.9 mEq/L (ref 3.5–5.1)
Sodium: 139 mEq/L (ref 135–145)

## 2010-06-19 LAB — CBC WITH DIFFERENTIAL/PLATELET
Basophils Absolute: 0 10*3/uL (ref 0.0–0.1)
Eosinophils Relative: 3.9 % (ref 0.0–5.0)
HCT: 43.7 % (ref 36.0–46.0)
Hemoglobin: 15.1 g/dL — ABNORMAL HIGH (ref 12.0–15.0)
Lymphocytes Relative: 39.3 % (ref 12.0–46.0)
Lymphs Abs: 3.3 10*3/uL (ref 0.7–4.0)
Monocytes Relative: 2.9 % — ABNORMAL LOW (ref 3.0–12.0)
Neutro Abs: 4.5 10*3/uL (ref 1.4–7.7)
RBC: 4.59 Mil/uL (ref 3.87–5.11)
RDW: 13.9 % (ref 11.5–14.6)
WBC: 8.3 10*3/uL (ref 4.5–10.5)

## 2010-06-19 LAB — HEPATIC FUNCTION PANEL
Albumin: 4.6 g/dL (ref 3.5–5.2)
Alkaline Phosphatase: 62 U/L (ref 39–117)
Bilirubin, Direct: 0 mg/dL (ref 0.0–0.3)
Total Protein: 7.3 g/dL (ref 6.0–8.3)

## 2010-06-19 LAB — RHEUMATOID FACTOR: Rhuematoid fact SerPl-aCnc: <10 IU/mL (ref <=14)

## 2010-06-19 LAB — TSH: TSH: 0.3 u[IU]/mL — ABNORMAL LOW (ref 0.35–5.50)

## 2010-06-19 LAB — ANA: Anti Nuclear Antibody(ANA): NEGATIVE

## 2010-06-20 ENCOUNTER — Telehealth: Payer: Self-pay | Admitting: Family Medicine

## 2010-06-20 NOTE — Telephone Encounter (Signed)
Please advise 

## 2010-06-20 NOTE — Telephone Encounter (Signed)
Will call pt with lab result

## 2010-06-20 NOTE — Progress Notes (Signed)
Quick Note:  Pt informed and she voiced her understanding ______ 

## 2010-06-20 NOTE — Telephone Encounter (Signed)
Triage vm------requesting lab results. °

## 2010-06-20 NOTE — Telephone Encounter (Signed)
See result note.  Was waiting for one of her labs and that is now back.

## 2010-06-21 ENCOUNTER — Telehealth: Payer: Self-pay | Admitting: Family Medicine

## 2010-06-21 MED ORDER — MELOXICAM 15 MG PO TABS: 15.0000 mg | ORAL_TABLET | Freq: Every day | ORAL | Status: DC

## 2010-06-21 NOTE — Telephone Encounter (Signed)
Pt informed on VM of new med to try

## 2010-06-21 NOTE — Telephone Encounter (Signed)
Let's try meloxicam 15 mg one daily.

## 2010-06-21 NOTE — Telephone Encounter (Signed)
Please advise 

## 2010-06-21 NOTE — Telephone Encounter (Signed)
Triage vm----------having a lot of  pain that is keeping her up all night. Tried Aleve and Advil. Wants something else to take.    ( [facility] in [place].)

## 2010-06-26 ENCOUNTER — Ambulatory Visit (INDEPENDENT_AMBULATORY_CARE_PROVIDER_SITE_OTHER): Payer: 59 | Admitting: Family Medicine

## 2010-06-26 ENCOUNTER — Encounter: Payer: Self-pay | Admitting: Family Medicine

## 2010-06-26 VITALS — BP 140/88 | HR 80 | Temp 98.7°F | Resp 12 | Wt 142.0 lb

## 2010-06-26 DIAGNOSIS — F411 Generalized anxiety disorder: Secondary | ICD-10-CM

## 2010-06-26 DIAGNOSIS — R002 Palpitations: Secondary | ICD-10-CM

## 2010-06-26 DIAGNOSIS — F419 Anxiety disorder, unspecified: Secondary | ICD-10-CM

## 2010-06-26 DIAGNOSIS — R0600 Dyspnea, unspecified: Secondary | ICD-10-CM

## 2010-06-26 DIAGNOSIS — R197 Diarrhea, unspecified: Secondary | ICD-10-CM

## 2010-06-26 DIAGNOSIS — E079 Disorder of thyroid, unspecified: Secondary | ICD-10-CM

## 2010-06-26 DIAGNOSIS — R0609 Other forms of dyspnea: Secondary | ICD-10-CM

## 2010-06-26 LAB — TSH: TSH: 1.35 u[IU]/mL (ref 0.35–5.50)

## 2010-06-26 LAB — T3, FREE: T3, Free: 3.1 pg/mL (ref 2.3–4.2)

## 2010-06-26 LAB — T4, FREE: Free T4: 0.78 ng/dL (ref 0.60–1.60)

## 2010-06-26 MED ORDER — ALPRAZOLAM 1 MG PO TABS: 1.0000 mg | ORAL_TABLET | Freq: Three times a day (TID) | ORAL | Status: AC | PRN

## 2010-06-26 MED ORDER — METOPROLOL TARTRATE 25 MG PO TABS: 25.0000 mg | ORAL_TABLET | ORAL | Status: DC

## 2010-06-26 NOTE — Progress Notes (Addendum)
  Subjective:    Patient ID: Laura Henson, female    DOB: 1975/07/25, 35 y.o.   MRN: [id number]  HPI Patient recently seen with fatigue and arthralgias. Several screening labs were normal-with exception of low TSH. Sedimentation rate and C-reactive protein normal. Rheumatoid factor and ANA antibodies normal. She did have a low TSH of 0.3. She presents now with progressive fatigue, intermittent nonbloody diarrhea, heart palpitations and occasional increased heart rate up to 110. She has continued body aches and feels jittery. She has restlessness and increased anxiety symptoms. Difficulty sleeping over the past several days. Only mild weight loss reportedly about 8 pounds over the past several weeks. We recommended followup repeat TSH and T4 patient is here for that today. She denies any chest pains. No dyspnea.  She has noticed some left-sided neck pain and the swelling around her thyroid region   Review of Systems  Constitutional: Positive for fatigue. Negative for fever and chills.  HENT: Positive for neck pain. Negative for trouble swallowing and voice change.   Eyes: Negative for visual disturbance.  Respiratory: Negative for shortness of breath and wheezing.   Cardiovascular: Positive for palpitations. Negative for chest pain and leg swelling.  Gastrointestinal: Negative for abdominal pain.  Genitourinary: Negative for dysuria.  Skin: Negative for rash.  Neurological: Positive for weakness. Negative for dizziness, seizures and syncope.  Hematological: Negative for adenopathy. Does not bruise/bleed easily.  Psychiatric/Behavioral: Positive for sleep disturbance. The patient is nervous/anxious.        Objective:   Physical Exam  Constitutional: She is oriented to person, place, and time. She appears well-developed and well-nourished.  HENT:  Head: Normocephalic and atraumatic.  Right Ear: External ear normal.  Left Ear: External ear normal.  Mouth/Throat: Oropharynx is clear and  moist.  Eyes: Pupils are equal, round, and reactive to light.  Neck: Normal range of motion. Neck supple.       No thyromegaly or neck mass but does have some tenderness of the left lobe of the thyroid  Cardiovascular: Normal rate, regular rhythm and normal heart sounds.   No murmur heard. Pulmonary/Chest: Effort normal and breath sounds normal. No respiratory distress. She has no wheezes. She has no rales.  Musculoskeletal: She exhibits no edema.  Lymphadenopathy:    She has no cervical adenopathy.  Neurological: She is alert and oriented to person, place, and time.  Psychiatric: She has a normal mood and affect. Her behavior is normal.          Assessment & Plan:  Patient presents with multiple symptoms including recent mild weight loss, nervousness, intermittent diarrhea, palpitations and recent low TSH suspicious for hyperthyroidism. Repeat TSH and T4 today. If elevated T4 and low TSH confirmed thyroid nuclear scan and endocrinology referral.  Start low dose Toprol 25 mg daily for palpitations.  Thyroid tests normal. Discussed with pt.  She has some progressive fatigue, dyspnea, palpitations, and 5-7 nonbloody stools per day for the past almost 2 weeks. Took Z-max about one month ago.  Recommend stool studies with C dif, O and P, and cx.  CXR and 2-d echo.  Empiric Flagyl pending C dif.

## 2010-06-27 ENCOUNTER — Telehealth: Payer: Self-pay | Admitting: *Deleted

## 2010-06-27 NOTE — Telephone Encounter (Signed)
Pt is asking for lab results.

## 2010-06-28 ENCOUNTER — Telehealth: Payer: Self-pay | Admitting: *Deleted

## 2010-06-28 ENCOUNTER — Ambulatory Visit (INDEPENDENT_AMBULATORY_CARE_PROVIDER_SITE_OTHER)
Admission: RE | Admit: 2010-06-28 | Discharge: 2010-06-28 | Disposition: A | Discharge status: Home / Self Care | Payer: 59 | Source: Ambulatory Visit | Attending: Family Medicine | Admitting: Family Medicine

## 2010-06-28 ENCOUNTER — Other Ambulatory Visit: Payer: Self-pay | Admitting: Family Medicine

## 2010-06-28 DIAGNOSIS — R0609 Other forms of dyspnea: Secondary | ICD-10-CM

## 2010-06-28 DIAGNOSIS — E876 Hypokalemia: Secondary | ICD-10-CM

## 2010-06-28 DIAGNOSIS — R0600 Dyspnea, unspecified: Secondary | ICD-10-CM

## 2010-06-28 DIAGNOSIS — R0989 Other specified symptoms and signs involving the circulatory and respiratory systems: Secondary | ICD-10-CM

## 2010-06-28 MED ORDER — METRONIDAZOLE 500 MG PO TABS: 500.0000 mg | ORAL_TABLET | Freq: Three times a day (TID) | ORAL | Status: AC

## 2010-06-28 NOTE — Progress Notes (Signed)
Addended by: Kristian Covey on: 06/28/2010 08:31 AM   Modules accepted: Orders

## 2010-06-28 NOTE — Telephone Encounter (Signed)
Highly doubtful this would cause her diarrhea and dyspnea.

## 2010-06-28 NOTE — Telephone Encounter (Signed)
Pt call back after speaking to Dr. Caryl Never to let him know she is not sure if she has had a tick bite or not.

## 2010-06-28 NOTE — Telephone Encounter (Signed)
Pt called stating that she has increase her neurotin to 900mg  1 month ago and is wondering if this could be causing her symptoms

## 2010-06-29 ENCOUNTER — Other Ambulatory Visit: Payer: 59

## 2010-06-29 NOTE — Telephone Encounter (Signed)
Pt aware of this. 

## 2010-06-29 NOTE — Progress Notes (Signed)
Quick Note:  Pt informed and she reports feeling a little better today ______

## 2010-07-02 LAB — OVA AND PARASITE SCREEN

## 2010-07-02 NOTE — Progress Notes (Signed)
Quick Note:  Pt informed, she reports feeling much, much better. FYI ______

## 2010-07-05 ENCOUNTER — Other Ambulatory Visit (HOSPITAL_COMMUNITY): Payer: 59 | Admitting: Radiology

## 2010-07-11 ENCOUNTER — Other Ambulatory Visit (HOSPITAL_COMMUNITY): Payer: 59 | Admitting: Radiology

## 2010-08-02 ENCOUNTER — Other Ambulatory Visit: Payer: Self-pay | Admitting: Family Medicine

## 2010-09-03 ENCOUNTER — Other Ambulatory Visit: Payer: Self-pay | Admitting: Family Medicine

## 2010-10-15 ENCOUNTER — Encounter: Payer: Self-pay | Admitting: Family Medicine

## 2010-10-15 ENCOUNTER — Ambulatory Visit (INDEPENDENT_AMBULATORY_CARE_PROVIDER_SITE_OTHER): Payer: 59 | Admitting: Family Medicine

## 2010-10-15 ENCOUNTER — Ambulatory Visit (INDEPENDENT_AMBULATORY_CARE_PROVIDER_SITE_OTHER)
Admission: RE | Admit: 2010-10-15 | Discharge: 2010-10-15 | Disposition: A | Discharge status: Home / Self Care | Payer: 59 | Source: Ambulatory Visit | Attending: Family Medicine | Admitting: Family Medicine

## 2010-10-15 VITALS — BP 120/82 | Temp 99.4°F | Wt 145.0 lb

## 2010-10-15 DIAGNOSIS — R109 Unspecified abdominal pain: Secondary | ICD-10-CM

## 2010-10-15 LAB — POCT URINALYSIS DIPSTICK
Protein, UA: NEGATIVE
Spec Grav, UA: 1.01
Urobilinogen, UA: 0.2
pH, UA: 5

## 2010-10-15 MED ORDER — KETOROLAC TROMETHAMINE 30 MG/ML IM SOLN
60.0000 mg | INTRAMUSCULAR | Status: AC
  Administered 2010-10-15: 60 mg via INTRAMUSCULAR

## 2010-10-15 MED ORDER — OXYCODONE-ACETAMINOPHEN 10-325 MG PO TABS: 1.0000 | ORAL_TABLET | Freq: Four times a day (QID) | ORAL | Status: AC | PRN

## 2010-10-15 MED ORDER — PROMETHAZINE HCL 25 MG/ML IJ SOLN
25.0000 mg | Freq: Once | INTRAMUSCULAR | Status: AC
  Administered 2010-10-15: 25 mg via INTRAMUSCULAR

## 2010-10-15 MED ORDER — TRAZODONE HCL 50 MG PO TABS: ORAL_TABLET | ORAL | Status: DC

## 2010-10-15 NOTE — Progress Notes (Signed)
  Subjective:    Patient ID: Laura Henson, female    DOB: Jun 19, 1975, 35 y.o.   MRN: [id number]  HPI Patient seen with right flank pain. Past history kidney stone 2 years ago. Similar symptoms now. Acute onset Friday of right flank pain with some radiation anterior. She had previous cholecystectomy and appendectomy. Went to urgent care yesterday. Placed on Flomax, Cipro, and Phenergan with Vicodin but poor pain control. Pain is 10 out of 10 at times. Fever up to 103 yesterday. Nausea with no vomiting. Pain initially intermittent and now is more constant.  No stool changes.  Back pain is greater than abdominal pain.  No gross hematuria.  Hx of endometriosis.  Past Medical History  Diagnosis Date  . MIGRAINE HEADACHE 11/27/2009  . 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 GERD 10/09/2006  . HYPERLIPIDEMIA 10/09/2006   Past Surgical History  Procedure Date  . Appendectomy 1996  . Cesarean section   . Cholecystectomy   . Hemoroidectomy     scar tissue  . Abdominal hysterectomy     DUB after childbirth, endometriosis  . Oophorectomy     reports that she has been smoking Cigarettes.  She has a 3 pack-year smoking history. She does not have any smokeless tobacco history on file. Her alcohol and drug histories not on file. family history is not on file. No Known Allergies     Review of Systems  Constitutional: Positive for fever. Negative for chills.  Cardiovascular: Negative for chest pain, palpitations and leg swelling.  Gastrointestinal: Positive for nausea and abdominal pain. Negative for vomiting and diarrhea.  Genitourinary: Positive for dysuria. Negative for frequency and hematuria.       Objective:   Physical Exam  Constitutional: She appears well-developed and well-nourished. No distress.  HENT:  Mouth/Throat: Oropharynx is clear and moist.  Cardiovascular: Normal rate, regular rhythm and normal heart sounds.   Pulmonary/Chest: Effort normal and breath sounds normal. No respiratory  distress. She has no wheezes. She has no rales.  Abdominal: Soft. She exhibits no distension and no mass. There is no rebound and no guarding.       Patient has some mild tenderness right lower quadrant to deep palpation. No guarding. No masses.  Musculoskeletal: She exhibits no edema.          Assessment & Plan:  Right flank pain. Rule out recurrent kidney stone. Repeat UA. Given duration of symptoms and reported fever CT scan to rule out stone or evidence for other findings such as pyelonephritis. Toradol 60 mg IM given and Phenergan 25 mg IM. Urology referral if any evidence for stone. Poor pain control with Vicodin.  Change to Percocet 10mg  po q 4 hours prn severe pain.

## 2010-12-21 ENCOUNTER — Ambulatory Visit
Admission: RE | Admit: 2010-12-21 | Discharge: 2010-12-21 | Disposition: A | Discharge status: Home / Self Care | Payer: Private Health Insurance - Indemnity | Source: Ambulatory Visit | Attending: Orthopedic Surgery | Admitting: Orthopedic Surgery

## 2010-12-21 ENCOUNTER — Other Ambulatory Visit: Payer: Self-pay | Admitting: Orthopedic Surgery

## 2010-12-21 DIAGNOSIS — M545 Low back pain, unspecified: Secondary | ICD-10-CM

## 2011-01-16 ENCOUNTER — Telehealth: Payer: Self-pay | Admitting: Family Medicine

## 2011-01-16 NOTE — Telephone Encounter (Signed)
Pt would like a copy of physical form she dropped off ? Past summer. Pt is requesting form to be mail to her.

## 2011-01-16 NOTE — Telephone Encounter (Signed)
Form printed and mailed.

## 2011-02-19 ENCOUNTER — Other Ambulatory Visit: Payer: Self-pay | Admitting: Orthopedic Surgery

## 2011-02-20 ENCOUNTER — Encounter (HOSPITAL_COMMUNITY): Payer: Self-pay

## 2011-02-20 ENCOUNTER — Encounter (HOSPITAL_COMMUNITY)
Admission: RE | Admit: 2011-02-20 | Discharge: 2011-02-20 | Disposition: A | Discharge status: Home / Self Care | Payer: Managed Care, Other (non HMO) | Source: Ambulatory Visit | Attending: Orthopedic Surgery | Admitting: Orthopedic Surgery

## 2011-02-20 ENCOUNTER — Other Ambulatory Visit: Payer: Self-pay

## 2011-02-20 HISTORY — DX: Nausea with vomiting, unspecified: R11.2

## 2011-02-20 HISTORY — DX: Headache: R51

## 2011-02-20 HISTORY — DX: Rheumatic fever without heart involvement: I00

## 2011-02-20 HISTORY — DX: Other specified postprocedural states: Z98.890

## 2011-02-20 LAB — DIFFERENTIAL
Basophils Absolute: 0.1 10*3/uL (ref 0.0–0.1)
Eosinophils Absolute: 0.2 10*3/uL (ref 0.0–0.7)
Eosinophils Relative: 2 % (ref 0–5)

## 2011-02-20 LAB — COMPREHENSIVE METABOLIC PANEL
ALT: 17 U/L (ref 0–35)
Alkaline Phosphatase: 83 U/L (ref 39–117)
BUN: 14 mg/dL (ref 6–23)
CO2: 27 mEq/L (ref 19–32)
Calcium: 10.5 mg/dL (ref 8.4–10.5)
GFR calc Af Amer: >90 mL/min (ref >90)
GFR calc non Af Amer: >90 mL/min (ref >90)
Glucose, Bld: 78 mg/dL (ref 70–99)
Potassium: 4 mEq/L (ref 3.5–5.1)
Sodium: 139 mEq/L (ref 135–145)

## 2011-02-20 LAB — ABO/RH: ABO/RH(D): O NEG

## 2011-02-20 LAB — SURGICAL PCR SCREEN: Staphylococcus aureus: NEGATIVE

## 2011-02-20 LAB — CBC
HCT: 42.9 % (ref 36.0–46.0)
Hemoglobin: 14.8 g/dL (ref 12.0–15.0)
MCH: 31.6 pg (ref 26.0–34.0)
RBC: 4.69 MIL/uL (ref 3.87–5.11)

## 2011-02-20 LAB — URINALYSIS, ROUTINE W REFLEX MICROSCOPIC
Bilirubin Urine: NEGATIVE
Glucose, UA: NEGATIVE mg/dL
Hgb urine dipstick: NEGATIVE
Specific Gravity, Urine: 1.011 (ref 1.005–1.030)
pH: 5.5 (ref 5.0–8.0)

## 2011-02-20 LAB — PROTIME-INR: Prothrombin Time: 13.2 seconds (ref 11.6–15.2)

## 2011-02-20 MED ORDER — CEFAZOLIN SODIUM-DEXTROSE 2-3 GM-% IV SOLR
2.0000 g | INTRAVENOUS | Status: AC
  Administered 2011-02-21: 2 g via INTRAVENOUS
  Filled 2011-02-20: qty 50

## 2011-02-20 NOTE — Pre-Procedure Instructions (Addendum)
20 Raveena Hebdon  02/20/2011   Your procedure is scheduled on:  2.7.13  Report to [facility] at530 AM.  Call this number if you have problems the morning of surgery: [phone]   Remember:   Do not eat food:After Midnight.  May have clear liquids: up to 4 Hours before arrival.  Clear liquids include soda, tea, black coffee, apple or grape juice, broth.  Take these medicines the morning of surgery with A SIP OF WATER:flexeril ,hydrocodone   Do not wear jewelry, make-up or nail polish.  Do not wear lotions, powders, or perfumes. You may wear deodorant.  Do not shave 48 hours prior to surgery.  Do not bring valuables to the hospital.  Contacts, dentures or bridgework may not be worn into surgery.  Leave suitcase in the car. After surgery it may be brought to your room.  For patients admitted to the hospital, checkout time is 11:00 AM the day of discharge.   Patients discharged the day of surgery will not be allowed to drive home.  Name and phone number of your driverdarren [phone]:   Special Instructions: Incentive Spirometry - Practice and bring it with you on the day of surgery. and CHG Shower Use Special Wash: 1/2 bottle night before surgery and 1/2 bottle morning of surgery.   Please read over the following fact sheets that you were given: Pain Booklet, Coughing and Deep Breathing, Blood Transfusion Information, MRSA Information and Surgical Site Infection Prevention

## 2011-02-21 ENCOUNTER — Ambulatory Visit (HOSPITAL_COMMUNITY): Payer: Managed Care, Other (non HMO)

## 2011-02-21 ENCOUNTER — Ambulatory Visit (HOSPITAL_COMMUNITY): Payer: Managed Care, Other (non HMO) | Admitting: Certified Registered"

## 2011-02-21 ENCOUNTER — Encounter (HOSPITAL_COMMUNITY)
Admission: RE | Disposition: A | Discharge status: Home / Self Care | Payer: Self-pay | Source: Ambulatory Visit | Attending: Orthopedic Surgery

## 2011-02-21 ENCOUNTER — Encounter (HOSPITAL_COMMUNITY): Payer: Self-pay | Admitting: Certified Registered"

## 2011-02-21 ENCOUNTER — Ambulatory Visit (HOSPITAL_COMMUNITY)
Admission: RE | Admit: 2011-02-21 | Discharge: 2011-02-21 | Disposition: A | Discharge status: Home / Self Care | Payer: Managed Care, Other (non HMO) | Source: Ambulatory Visit | Attending: Orthopedic Surgery | Admitting: Orthopedic Surgery

## 2011-02-21 ENCOUNTER — Encounter (HOSPITAL_COMMUNITY): Payer: Self-pay | Admitting: *Deleted

## 2011-02-21 DIAGNOSIS — Z01810 Encounter for preprocedural cardiovascular examination: Secondary | ICD-10-CM | POA: Insufficient documentation

## 2011-02-21 DIAGNOSIS — M533 Sacrococcygeal disorders, not elsewhere classified: Secondary | ICD-10-CM

## 2011-02-21 DIAGNOSIS — Z01818 Encounter for other preprocedural examination: Secondary | ICD-10-CM | POA: Insufficient documentation

## 2011-02-21 DIAGNOSIS — Z01812 Encounter for preprocedural laboratory examination: Secondary | ICD-10-CM | POA: Insufficient documentation

## 2011-02-21 DIAGNOSIS — Z01811 Encounter for preprocedural respiratory examination: Secondary | ICD-10-CM | POA: Insufficient documentation

## 2011-02-21 SURGERY — SACROILIAC JOINT FUSION
Anesthesia: General | Site: Back | Laterality: Left | Wound class: Clean

## 2011-02-21 MED ORDER — PROPOFOL 10 MG/ML IV EMUL
INTRAVENOUS | Status: DC | PRN
  Administered 2011-02-21: 190 mg via INTRAVENOUS
  Administered 2011-02-21: 10 mg via INTRAVENOUS

## 2011-02-21 MED ORDER — DIAZEPAM 5 MG/ML IJ SOLN
5.0000 mg | Freq: Once | INTRAMUSCULAR | Status: AC
  Administered 2011-02-21: 5 mg via INTRAVENOUS

## 2011-02-21 MED ORDER — HYDROMORPHONE HCL PF 1 MG/ML IJ SOLN
0.2500 mg | INTRAMUSCULAR | Status: DC | PRN
  Administered 2011-02-21 (×7): 0.5 mg via INTRAVENOUS

## 2011-02-21 MED ORDER — DIAZEPAM 5 MG/ML IJ SOLN
INTRAMUSCULAR | Status: AC
  Filled 2011-02-21: qty 2

## 2011-02-21 MED ORDER — SUFENTANIL CITRATE 50 MCG/ML IV SOLN
INTRAVENOUS | Status: DC | PRN
  Administered 2011-02-21 (×3): 10 ug via INTRAVENOUS
  Administered 2011-02-21: 20 ug via INTRAVENOUS

## 2011-02-21 MED ORDER — PROMETHAZINE HCL 25 MG/ML IJ SOLN
6.2500 mg | Freq: Once | INTRAMUSCULAR | Status: AC
  Administered 2011-02-21: 6.25 mg via INTRAVENOUS

## 2011-02-21 MED ORDER — DEXAMETHASONE SODIUM PHOSPHATE 4 MG/ML IJ SOLN
INTRAMUSCULAR | Status: DC | PRN
  Administered 2011-02-21: 10 mg via INTRAVENOUS

## 2011-02-21 MED ORDER — ACETAMINOPHEN 10 MG/ML IV SOLN
INTRAVENOUS | Status: AC
  Filled 2011-02-21: qty 100

## 2011-02-21 MED ORDER — HYDROMORPHONE HCL PF 1 MG/ML IJ SOLN
INTRAMUSCULAR | Status: AC
  Filled 2011-02-21: qty 1

## 2011-02-21 MED ORDER — PROMETHAZINE HCL 25 MG/ML IJ SOLN
INTRAMUSCULAR | Status: AC
  Filled 2011-02-21: qty 1

## 2011-02-21 MED ORDER — BUPIVACAINE-EPINEPHRINE PF 0.25-1:200000 % IJ SOLN
INTRAMUSCULAR | Status: DC | PRN
  Administered 2011-02-21: 20 mL

## 2011-02-21 MED ORDER — MIDAZOLAM HCL 5 MG/5ML IJ SOLN
INTRAMUSCULAR | Status: DC | PRN
  Administered 2011-02-21: 2 mg via INTRAVENOUS

## 2011-02-21 MED ORDER — 0.9 % SODIUM CHLORIDE (POUR BTL) OPTIME
TOPICAL | Status: DC | PRN
  Administered 2011-02-21: 1000 mL

## 2011-02-21 MED ORDER — LACTATED RINGERS IV SOLN
INTRAVENOUS | Status: DC | PRN
  Administered 2011-02-21 (×2): via INTRAVENOUS

## 2011-02-21 MED ORDER — DROPERIDOL 2.5 MG/ML IJ SOLN
INTRAMUSCULAR | Status: AC
  Filled 2011-02-21: qty 2

## 2011-02-21 MED ORDER — ONDANSETRON HCL 4 MG/2ML IJ SOLN
4.0000 mg | Freq: Four times a day (QID) | INTRAMUSCULAR | Status: AC | PRN
  Administered 2011-02-21: 4 mg via INTRAVENOUS

## 2011-02-21 MED ORDER — SCOPOLAMINE 1 MG/3DAYS TD PT72
MEDICATED_PATCH | TRANSDERMAL | Status: DC | PRN
  Administered 2011-02-21: 1 via TRANSDERMAL

## 2011-02-21 MED ORDER — ONDANSETRON HCL 4 MG/2ML IJ SOLN
INTRAMUSCULAR | Status: DC | PRN
  Administered 2011-02-21: 4 mg via INTRAVENOUS

## 2011-02-21 MED ORDER — DEXTROSE 5 % IV SOLN
INTRAVENOUS | Status: DC | PRN
  Administered 2011-02-21: 07:00:00 via INTRAVENOUS

## 2011-02-21 MED ORDER — ROCURONIUM BROMIDE 100 MG/10ML IV SOLN
INTRAVENOUS | Status: DC | PRN
  Administered 2011-02-21: 50 mg via INTRAVENOUS

## 2011-02-21 MED ORDER — DROPERIDOL 2.5 MG/ML IJ SOLN
0.6250 mg | INTRAMUSCULAR | Status: DC | PRN
  Administered 2011-02-21: 0.625 mg via INTRAVENOUS

## 2011-02-21 MED ORDER — PROMETHAZINE HCL 12.5 MG PO TABS: 12.5000 mg | ORAL_TABLET | Freq: Four times a day (QID) | ORAL | Status: AC | PRN

## 2011-02-21 MED ORDER — POVIDONE-IODINE 7.5 % EX SOLN: Freq: Once | CUTANEOUS | Status: DC

## 2011-02-21 SURGICAL SUPPLY — 64 items
APL SKNCLS STERI-STRIP NONHPOA (GAUZE/BANDAGES/DRESSINGS)
BAG DECANTER FOR FLEXI CONT (MISCELLANEOUS) ×1 IMPLANT
BENZOIN TINCTURE PRP APPL 2/3 (GAUZE/BANDAGES/DRESSINGS) ×1 IMPLANT
BLADE SURG 10 STRL SS (BLADE) ×2 IMPLANT
BLADE SURG 11 STRL SS (BLADE) ×2 IMPLANT
BLADE SURG ROTATE 9660 (MISCELLANEOUS) ×1 IMPLANT
CANISTER SUCTION 2500CC (MISCELLANEOUS) ×1 IMPLANT
CAP-I-FUSE IMPLANT SYSTEM ×1 IMPLANT
CLOSURE STERI STRIP 1/2 X4 (GAUZE/BANDAGES/DRESSINGS) ×1 IMPLANT
CLOTH BEACON ORANGE TIMEOUT ST (SAFETY) ×2 IMPLANT
COVER MAYO STAND STRL (DRAPES) ×2 IMPLANT
COVER SURGICAL LIGHT HANDLE (MISCELLANEOUS) ×3 IMPLANT
DRAPE C-ARM 42X72 X-RAY (DRAPES) ×1 IMPLANT
DRAPE INCISE IOBAN 66X45 STRL (DRAPES) ×2 IMPLANT
DRAPE ORTHO SPLIT 77X108 STRL (DRAPES) ×2
DRAPE POUCH INSTRU U-SHP 10X18 (DRAPES) ×2 IMPLANT
DRAPE SURG 17X23 STRL (DRAPES) ×6 IMPLANT
DRAPE SURG ORHT 6 SPLT 77X108 (DRAPES) ×1 IMPLANT
DURAPREP 26ML APPLICATOR (WOUND CARE) ×2 IMPLANT
ELECT CAUTERY BLADE 6.4 (BLADE) ×2 IMPLANT
ELECT REM PT RETURN 9FT ADLT (ELECTROSURGICAL) ×2
ELECTRODE REM PT RTRN 9FT ADLT (ELECTROSURGICAL) ×1 IMPLANT
GAUZE SPONGE 4X4 12PLY STRL LF (GAUZE/BANDAGES/DRESSINGS) ×1 IMPLANT
GAUZE SPONGE 4X4 16PLY XRAY LF (GAUZE/BANDAGES/DRESSINGS) ×2 IMPLANT
GLOVE BIO SURGEON STRL SZ8 (GLOVE) ×3 IMPLANT
GLOVE BIOGEL PI IND STRL 8 (GLOVE) ×1 IMPLANT
GLOVE BIOGEL PI INDICATOR 8 (GLOVE) ×2
GLOVE BIOGEL PI ORTHO PRO SZ7 (GLOVE) ×1
GLOVE PI ORTHO PRO STRL SZ7 (GLOVE) IMPLANT
GLOVE SURG SS PI 7.0 STRL IVOR (GLOVE) ×1 IMPLANT
GLOVE SURG SS PI 7.5 STRL IVOR (GLOVE) ×2 IMPLANT
GOWN STRL NON-REIN LRG LVL3 (GOWN DISPOSABLE) ×3 IMPLANT
GOWN STRL REIN XL XLG (GOWN DISPOSABLE) ×3 IMPLANT
KIT BASIN OR (CUSTOM PROCEDURE TRAY) ×2 IMPLANT
KIT ROOM TURNOVER OR (KITS) ×2 IMPLANT
MANIFOLD NEPTUNE II (INSTRUMENTS) ×2 IMPLANT
NDL HYPO 25GX1X1/2 BEV (NEEDLE) ×1 IMPLANT
NEEDLE 22X1 1/2 (OR ONLY) (NEEDLE) ×2 IMPLANT
NEEDLE HYPO 25GX1X1/2 BEV (NEEDLE) ×2 IMPLANT
NS IRRIG 1000ML POUR BTL (IV SOLUTION) ×2 IMPLANT
PACK SURGICAL SETUP 50X90 (CUSTOM PROCEDURE TRAY) ×2 IMPLANT
PACK UNIVERSAL I (CUSTOM PROCEDURE TRAY) ×2 IMPLANT
PAD ARMBOARD 7.5X6 YLW CONV (MISCELLANEOUS) ×4 IMPLANT
PENCIL BUTTON HOLSTER BLD 10FT (ELECTRODE) ×2 IMPLANT
SPONGE GAUZE 4X4 12PLY (GAUZE/BANDAGES/DRESSINGS) ×1 IMPLANT
SPONGE LAP 18X18 X RAY DECT (DISPOSABLE) ×2 IMPLANT
STRIP CLOSURE SKIN 1/2X4 (GAUZE/BANDAGES/DRESSINGS) ×2 IMPLANT
SUT MNCRL AB 3-0 PS2 18 (SUTURE) ×2 IMPLANT
SUT VIC AB 0 CT1 18XCR BRD 8 (SUTURE) ×1 IMPLANT
SUT VIC AB 0 CT1 27 (SUTURE)
SUT VIC AB 0 CT1 27XBRD ANBCTR (SUTURE) ×1 IMPLANT
SUT VIC AB 0 CT1 8-18 (SUTURE) ×2
SUT VIC AB 1 CT1 18XCR BRD 8 (SUTURE) IMPLANT
SUT VIC AB 1 CT1 8-18 (SUTURE) ×2
SUT VIC AB 2-0 CT2 18 VCP726D (SUTURE) ×2 IMPLANT
SYR BULB IRRIGATION 50ML (SYRINGE) ×2 IMPLANT
SYR CONTROL 10ML LL (SYRINGE) ×2 IMPLANT
TAPE CLOTH SURG 6X10 WHT LF (GAUZE/BANDAGES/DRESSINGS) ×1 IMPLANT
TOWEL OR 17X24 6PK STRL BLUE (TOWEL DISPOSABLE) ×2 IMPLANT
TOWEL OR 17X26 10 PK STRL BLUE (TOWEL DISPOSABLE) ×3 IMPLANT
TUBE CONNECTING 12X1/4 (SUCTIONS) ×2 IMPLANT
UNDERPAD 30X30 INCONTINENT (UNDERPADS AND DIAPERS) ×1 IMPLANT
WATER STERILE IRR 1000ML POUR (IV SOLUTION) ×1 IMPLANT
YANKAUER SUCT BULB TIP NO VENT (SUCTIONS) ×2 IMPLANT

## 2011-02-21 NOTE — H&P (Signed)
PREOPERATIVE H&P  Chief Complaint: left buttock pain  HPI: Laura Henson is a 36 y.o. female who presents with left buttock pain  Past Medical History  Diagnosis Date  . PONV (postoperative nausea and vomiting)   . Rheumatic fever     27 yrs old  . Chronic kidney disease     stone hx  . Headache     migraines hx   Past Surgical History  Procedure Date  . Abdominal hysterectomy   . Cesarean section   . Cholecystectomy   . Colon surgery     lsft hemicoloning-endometriosis   History   Social History  . Marital Status: Married    Spouse Name: N/A    Number of Children: N/A  . Years of Education: N/A   Social History Main Topics  . Smoking status: Current Everyday Smoker -- 0.5 packs/day  . Smokeless tobacco: None  . Alcohol Use: No  . Drug Use: No  . Sexually Active: Yes     hysterectomy   Other Topics Concern  . None   Social History Narrative  . None   History reviewed. No pertinent family history. No Known Allergies Prior to Admission medications   Medication Sig Start Date End Date Taking? Authorizing Provider  acidophilus (RISAQUAD) CAPS Take 2 capsules by mouth daily.   Yes Historical Provider, MD  cyclobenzaprine (FLEXERIL) 10 MG tablet Take 10 mg by mouth 3 (three) times daily as needed. For muscle spasm   Yes Historical Provider, MD  HYDROcodone-acetaminophen (NORCO) 5-325 MG per tablet Take 1-2 tablets by mouth every 4 (four) hours as needed. For pain   Yes Historical Provider, MD  ketorolac (TORADOL) 10 MG tablet Take 10 mg by mouth every 6 (six) hours as needed. For pain   Yes Historical Provider, MD  Multiple Vitamins-Minerals (MULTIVITAMINS THER. W/MINERALS) TABS Take 1 tablet by mouth daily.   Yes Historical Provider, MD  traZODone (DESYREL) 150 MG tablet Take 150 mg by mouth at bedtime as needed. For sleep   Yes Historical Provider, MD  Omega-3 Fatty Acids (FISH OIL PO) Take 3 capsules by mouth daily.    Historical Provider, MD     All  other systems have been reviewed and were otherwise negative with the exception of those mentioned in the HPI and as above.  Physical Exam: Filed Vitals:   02/21/11 0557  BP: 98/72  Pulse: 81  Temp: 98.5 F (36.9 C)  Resp: 18    General: Alert, no acute distress Cardiovascular: No pedal edema Respiratory: No cyanosis, no use of accessory musculature GI: No organomegaly, abdomen is soft and non-tender Skin: No lesions in the area of chief complaint Neurologic: Sensation intact distally Psychiatric: Patient is competent for consent with normal mood and affect Lymphatic: No axillary or cervical lymphadenopathy  MUSCULOSKELETAL: + TTP left SI region  Assessment/Plan: Left sacroliac joint pain Plan for Procedure(s): SACROILIAC JOINT FUSION   Emilee Hero, MD 02/21/2011 6:41 AM

## 2011-02-21 NOTE — Transfer of Care (Signed)
Immediate Anesthesia Transfer of Care Note  Patient: Laura Henson  Procedure(s) Performed:  SACROILIAC JOINT FUSION - Left sided sacroliac joint fusion  Patient Location: PACU  Anesthesia Type: General  Level of Consciousness: awake, alert  and oriented  Airway & Oxygen Therapy: Patient Spontanous Breathing and Patient connected to nasal cannula oxygen  Post-op Assessment: Report given to PACU RN, Post -op Vital signs reviewed and stable and Patient moving all extremities  Post vital signs: Reviewed and stable  Complications: persistent nausea and vomiting

## 2011-02-21 NOTE — Anesthesia Preprocedure Evaluation (Addendum)
Anesthesia Evaluation  Patient identified by MRN, date of birth, ID band Patient awake    Reviewed: Allergy & Precautions, H&P , NPO status , Patient's Chart, lab work & pertinent test results  History of Anesthesia Complications (+) PONV  Airway Mallampati: II TM Distance: >3 FB Neck ROM: full    Dental  (+) Teeth Intact   Pulmonary          Cardiovascular     Neuro/Psych  Headaches,    GI/Hepatic   Endo/Other    Renal/GU      Musculoskeletal   Abdominal   Peds  Hematology   Anesthesia Other Findings   Reproductive/Obstetrics                          Anesthesia Physical Anesthesia Plan  ASA: II  Anesthesia Plan: General   Post-op Pain Management:    Induction: Intravenous  Airway Management Planned: Oral ETT  Additional Equipment:   Intra-op Plan:   Post-operative Plan: Extubation in OR  Informed Consent: I have reviewed the patients History and Physical, chart, labs and discussed the procedure including the risks, benefits and alternatives for the proposed anesthesia with the patient or authorized representative who has indicated his/her understanding and acceptance.     Plan Discussed with: CRNA and Surgeon  Anesthesia Plan Comments:         Anesthesia Quick Evaluation

## 2011-02-21 NOTE — Preoperative (Signed)
Beta Blockers   Reason not to administer Beta Blockers:Not Applicable 

## 2011-02-21 NOTE — Anesthesia Postprocedure Evaluation (Signed)
Anesthesia Post Note  Patient: Laura Henson  Procedure(s) Performed:  SACROILIAC JOINT FUSION - Left sided sacroliac joint fusion  Anesthesia type: General  Patient location: PACU  Post pain: Pain level controlled and Adequate analgesia  Post assessment: Post-op Vital signs reviewed, Patient's Cardiovascular Status Stable, Respiratory Function Stable, Patent Airway and Pain level controlled  Last Vitals:  Filed Vitals:   02/21/11 1052  BP: 120/75  Pulse: 81  Temp:   Resp: 17    Post vital signs: Reviewed and stable  Level of consciousness: awake, alert  and oriented  Complications: No apparent anesthesia complications

## 2011-02-22 NOTE — Op Note (Signed)
Laura Henson, Laura Henson      ACCOUNT NO.:  [id number]  MEDICAL RECORD NO.:  [id number]  LOCATION:  [facility]                         FACILITY:  [facility]  PHYSICIAN:  Estill Bamberg, MD      DATE OF BIRTH:  January 30, 1975  DATE OF PROCEDURE:  02/21/2011 DATE OF DISCHARGE:  02/21/2011                              OPERATIVE REPORT   PREOPERATIVE DIAGNOSIS:  Left-sided sacroiliac joint dysfunction.  POSTOPERATIVE DIAGNOSIS:  Left-sided sacroiliac joint dysfunction.  PROCEDURE: 1. Minimally invasive left-sided sacroiliac joint fusion. 2. Intraoperative use of fluoroscopy.  SURGEON:  Estill Bamberg, MD  ASSISTANT:  None.  ANESTHESIA:  General endotracheal anesthesia.  COMPLICATIONS:  None.  DISPOSITION:  Stable.  INDICATIONS FOR PROCEDURE:  Briefly, Laura Henson is a very pleasant 36 year old female who initially presented to me with pain in the left buttock and left leg.  The patient's left radicular pain did resolve with conservative care.  However, she did continue to have pain in the region of the left buttock.  I did feel this was secondary to sacroiliac joint dysfunction and the patient did ultimately go forward with a left-sided sacroiliac joint injection.  The patient was very clear in stating that she got complete and profound relief in her pain, but her relief was only temporary.  Given her only temporary relief in her pain, we did make a decision to go forward with a left-sided sacroiliac joint fusion. The patient did fully understand the risks and alternatives to surgery, including an RFA.  However, the patient did elect to go forward with the surgical fusion.  OPERATIVE DETAILS:  On February 20, 2010, the patient was brought to surgery and general endotracheal anesthesia was administered.  The patient was placed prone, and a well-padded flat Jackson bed with gel rolls.  The region on the left buttock was prepped and draped in usual sterile fashion.  SCDs were placed and a  time-out procedure was performed.  Antibiotics were given.  At this point, I did bring in lateral fluoroscopy.  I did Laura Henson out the sacral ala, and the posterior border of the sacrum.  I then made a 3-cm incision 1 cm caudad to the sacral ala.  Then, under lateral fluoroscopy, I did place a guidewire over the posterior border of the sacrum approximately 1 cm distal to the ala.  The guidewire was advanced across the sacroiliac joint using both inlet and outlet views to confirm appropriate trajectory of the wire. The wire was advanced to approximately the medial border of the S1 foramen.  I then went forward with using cannulated drill and then a broach.  I did ultimately feel that a 60 mm implant would be the most appropriate fit and a 7 x 60 mm implant.  Using the high fuse sacroiliac joint fusion instrumentation system was advanced across the sacroiliac joint.  I did note an excellent press fit.  I then placed a second implant in line with the S1 neural foramen and a third implant below second in the manner described previously.  7 x 45 mm implants were used.  Of note, I did liberally use both lateral and AP floro to confirm appropriate trajectory of the implants.  Also of note, the S1 neural foramen was never violated  at any point throughout the procedure.  At the termination of the procedure, the wound was copiously irrigated. The fascia was closed using 0 Vicryl, and the subcutaneous layer was closed using 2-0 Vicryl, the skin was closed using 3-0 Monocryl.  All instrument counts were correct at the termination of the procedure.     Estill Bamberg, MD     MD/MEDQ  D:  02/21/2011  T:  02/22/2011  Job:  [id number]  cc:   Laura Henson, M.D.

## 2011-03-01 ENCOUNTER — Encounter: Payer: Self-pay | Admitting: Family Medicine

## 2011-03-25 ENCOUNTER — Telehealth: Payer: Self-pay | Admitting: Family Medicine

## 2011-03-25 NOTE — Telephone Encounter (Signed)
Pt had question regarding flu and is requesting you contact her

## 2011-03-25 NOTE — Telephone Encounter (Signed)
Pt 4 weeks post-op, hobbling around on crutches.  Both sons have been put on Tamiful for flu like sx, pediatrician suggested pt contact PCP to see if preventively she should also be on it.  She did get the flu vaccine, she has no symptoms at this time. [facility] [employer]

## 2011-03-25 NOTE — Telephone Encounter (Signed)
I dont think so if she had flu vaccine.  I don't know if sons had confirmed influenza but we have not seen much recently.

## 2011-03-26 NOTE — Telephone Encounter (Signed)
Pt informed

## 2011-04-11 ENCOUNTER — Other Ambulatory Visit (INDEPENDENT_AMBULATORY_CARE_PROVIDER_SITE_OTHER): Payer: Managed Care, Other (non HMO)

## 2011-04-11 DIAGNOSIS — Z0000 Encounter for general adult medical examination without abnormal findings: Secondary | ICD-10-CM

## 2011-04-11 LAB — BASIC METABOLIC PANEL
BUN: 8 mg/dL (ref 6–23)
CO2: 27 mEq/L (ref 19–32)
Chloride: 105 mEq/L (ref 96–112)
Creatinine, Ser: 0.7 mg/dL (ref 0.4–1.2)
Glucose, Bld: 98 mg/dL (ref 70–99)

## 2011-04-11 LAB — CBC WITH DIFFERENTIAL/PLATELET
Basophils Relative: 0.7 % (ref 0.0–3.0)
Eosinophils Absolute: 0.1 10*3/uL (ref 0.0–0.7)
MCHC: 34.1 g/dL (ref 30.0–36.0)
MCV: 94.5 fl (ref 78.0–100.0)
Monocytes Absolute: 0.3 10*3/uL (ref 0.1–1.0)
Neutrophils Relative %: 64.3 % (ref 43.0–77.0)
Platelets: 218 10*3/uL (ref 150.0–400.0)

## 2011-04-11 LAB — POCT URINALYSIS DIPSTICK
Bilirubin, UA: NEGATIVE
Blood, UA: NEGATIVE
Nitrite, UA: NEGATIVE
Spec Grav, UA: 1.01
pH, UA: 6

## 2011-04-11 LAB — HEPATIC FUNCTION PANEL
Bilirubin, Direct: 0 mg/dL (ref 0.0–0.3)
Total Bilirubin: 0.4 mg/dL (ref 0.3–1.2)
Total Protein: 7 g/dL (ref 6.0–8.3)

## 2011-04-11 LAB — LIPID PANEL
HDL: 52.4 mg/dL (ref >39.00)
Triglycerides: 105 mg/dL (ref 0.0–149.0)

## 2011-04-11 LAB — TSH: TSH: 1.05 u[IU]/mL (ref 0.35–5.50)

## 2011-04-18 ENCOUNTER — Encounter: Payer: Self-pay | Admitting: Family Medicine

## 2011-04-18 ENCOUNTER — Ambulatory Visit (INDEPENDENT_AMBULATORY_CARE_PROVIDER_SITE_OTHER): Payer: Managed Care, Other (non HMO) | Admitting: Family Medicine

## 2011-04-18 VITALS — BP 110/78 | HR 72 | Temp 99.0°F | Resp 12 | Ht 69.0 in | Wt 142.0 lb

## 2011-04-18 DIAGNOSIS — E785 Hyperlipidemia, unspecified: Secondary | ICD-10-CM

## 2011-04-18 DIAGNOSIS — Z720 Tobacco use: Secondary | ICD-10-CM

## 2011-04-18 DIAGNOSIS — K589 Irritable bowel syndrome without diarrhea: Secondary | ICD-10-CM

## 2011-04-18 DIAGNOSIS — B079 Viral wart, unspecified: Secondary | ICD-10-CM

## 2011-04-18 DIAGNOSIS — Z0000 Encounter for general adult medical examination without abnormal findings: Secondary | ICD-10-CM

## 2011-04-18 DIAGNOSIS — F17200 Nicotine dependence, unspecified, uncomplicated: Secondary | ICD-10-CM

## 2011-04-18 MED ORDER — CILIDINIUM-CHLORDIAZEPOXIDE 2.5-5 MG PO CAPS: 1.0000 | ORAL_CAPSULE | Freq: Three times a day (TID) | ORAL | Status: DC | PRN

## 2011-04-18 MED ORDER — ATORVASTATIN CALCIUM 20 MG PO TABS: 20.0000 mg | ORAL_TABLET | Freq: Every day | ORAL | Status: DC

## 2011-04-18 MED ORDER — BUPROPION HCL ER (XL) 300 MG PO TB24: 300.0000 mg | ORAL_TABLET | Freq: Every day | ORAL | Status: DC

## 2011-04-18 NOTE — Patient Instructions (Signed)
Smoking Cessation This document explains the best ways for you to quit smoking and new treatments to help. It lists new medicines that can double or triple your chances of quitting and quitting for good. It also considers ways to avoid relapses and concerns you may have about quitting, including weight gain. NICOTINE: A POWERFUL ADDICTION If you have tried to quit smoking, you know how hard it can be. It is hard because nicotine is a very addictive drug. For some people, it can be as addictive as heroin or cocaine. Usually, people make 2 or 3 tries, or more, before finally being able to quit. Each time you try to quit, you can learn about what helps and what hurts. Quitting takes hard work and a lot of effort, but you can quit smoking. QUITTING SMOKING IS ONE OF THE MOST IMPORTANT THINGS YOU WILL EVER DO.  You will live longer, feel better, and live better.   The impact on your body of quitting smoking is felt almost immediately:   Within 20 minutes, blood pressure decreases. Pulse returns to its normal level.   After 8 hours, carbon monoxide levels in the blood return to normal. Oxygen level increases.   After 24 hours, chance of heart attack starts to decrease. Breath, hair, and body stop smelling like smoke.   After 48 hours, damaged nerve endings begin to recover. Sense of taste and smell improve.   After 72 hours, the body is virtually free of nicotine. Bronchial tubes relax and breathing becomes easier.   After 2 to 12 weeks, lungs can hold more air. Exercise becomes easier and circulation improves.   Quitting will reduce your risk of having a heart attack, stroke, cancer, or lung disease:   After 1 year, the risk of coronary heart disease is cut in half.   After 5 years, the risk of stroke falls to the same as a nonsmoker.   After 10 years, the risk of lung cancer is cut in half and the risk of other cancers decreases significantly.   After 15 years, the risk of coronary heart  disease drops, usually to the level of a nonsmoker.   If you are pregnant, quitting smoking will improve your chances of having a healthy baby.   The people you live with, especially your children, will be healthier.   You will have extra money to spend on things other than cigarettes.  FIVE KEYS TO QUITTING Studies have shown that these 5 steps will help you quit smoking and quit for good. You have the best chances of quitting if you use them together: 1. Get ready.  2. Get support and encouragement.  3. Learn new skills and behaviors.  4. Get medicine to reduce your nicotine addiction and use it correctly.  5. Be prepared for relapse or difficult situations. Be determined to continue trying to quit, even if you do not succeed at first.  1. GET READY  Set a quit date.   Change your environment.   Get rid of ALL cigarettes, ashtrays, matches, and lighters in your home, car, and place of work.   Do not let people smoke in your home.   Review your past attempts to quit. Think about what worked and what did not.   Once you quit, do not smoke. NOT EVEN A PUFF!  2. GET SUPPORT AND ENCOURAGEMENT Studies have shown that you have a better chance of being successful if you have help. You can get support in many ways.  Tell   your family, friends, and coworkers that you are going to quit and need their support. Ask them not to smoke around you.   Talk to your caregivers (doctor, dentist, nurse, pharmacist, psychologist, and/or smoking counselor).   Get individual, group, or telephone counseling and support. The more counseling you have, the better your chances are of quitting. Programs are available at local hospitals and health centers. Call your local health department for information about programs in your area.   Spiritual beliefs and practices may help some smokers quit.   Quit meters are small computer programs online or downloadable that keep track of quit statistics, such as amount  of "quit-time," cigarettes not smoked, and money saved.   Many smokers find one or more of the many self-help books available useful in helping them quit and stay off tobacco.  3. LEARN NEW SKILLS AND BEHAVIORS  Try to distract yourself from urges to smoke. Talk to someone, go for a walk, or occupy your time with a task.   When you first try to quit, change your routine. Take a different route to work. Drink tea instead of coffee. Eat breakfast in a different place.   Do something to reduce your stress. Take a hot bath, exercise, or read a book.   Plan something enjoyable to do every day. Reward yourself for not smoking.   Explore interactive web-based programs that specialize in helping you quit.  4. GET MEDICINE AND USE IT CORRECTLY Medicines can help you stop smoking and decrease the urge to smoke. Combining medicine with the above behavioral methods and support can quadruple your chances of successfully quitting smoking. The [place] Food and Drug Administration (FDA) has approved 7 medicines to help you quit smoking. These medicines fall into 3 categories.  Nicotine replacement therapy (delivers nicotine to your body without the negative effects and risks of smoking):   Nicotine gum: Available over-the-counter.   Nicotine lozenges: Available over-the-counter.   Nicotine inhaler: Available by prescription.   Nicotine nasal spray: Available by prescription.   Nicotine skin patches (transdermal): Available by prescription and over-the-counter.   Antidepressant medicine (helps people abstain from smoking, but how this works is unknown):   Bupropion sustained-release (SR) tablets: Available by prescription.   Nicotinic receptor partial agonist (simulates the effect of nicotine in your brain):   Varenicline tartrate tablets: Available by prescription.   Ask your caregiver for advice about which medicines to use and how to use them. Carefully read the information on the package.    Everyone who is trying to quit may benefit from using a medicine. If you are pregnant or trying to become pregnant, nursing an infant, you are under age 18, or you smoke fewer than 10 cigarettes per day, talk to your caregiver before taking any nicotine replacement medicines.   You should stop using a nicotine replacement product and call your caregiver if you experience nausea, dizziness, weakness, vomiting, fast or irregular heartbeat, mouth problems with the lozenge or gum, or redness or swelling of the skin around the patch that does not go away.   Do not use any other product containing nicotine while using a nicotine replacement product.   Talk to your caregiver before using these products if you have diabetes, heart disease, asthma, stomach ulcers, you had a recent heart attack, you have high blood pressure that is not controlled with medicine, a history of irregular heartbeat, or you have been prescribed medicine to help you quit smoking.  5. BE PREPARED FOR RELAPSE OR   DIFFICULT SITUATIONS  Most relapses occur within the first 3 months after quitting. Do not be discouraged if you start smoking again. Remember, most people try several times before they finally quit.   You may have symptoms of withdrawal because your body is used to nicotine. You may crave cigarettes, be irritable, feel very hungry, cough often, get headaches, or have difficulty concentrating.   The withdrawal symptoms are only temporary. They are strongest when you first quit, but they will go away within 10 to 14 days.  Here are some difficult situations to watch for:  Alcohol. Avoid drinking alcohol. Drinking lowers your chances of successfully quitting.   Caffeine. Try to reduce the amount of caffeine you consume. It also lowers your chances of successfully quitting.   Other smokers. Being around smoking can make you want to smoke. Avoid smokers.   Weight gain. Many smokers will gain weight when they quit, usually  less than 10 pounds. Eat a healthy diet and stay active. Do not let weight gain distract you from your main goal, quitting smoking. Some medicines that help you quit smoking may also help delay weight gain. You can always lose the weight gained after you quit.   Bad mood or depression. There are a lot of ways to improve your mood other than smoking.  If you are having problems with any of these situations, talk to your caregiver. SPECIAL SITUATIONS AND CONDITIONS Studies suggest that everyone can quit smoking. Your situation or condition can give you a special reason to quit.  Pregnant women/new mothers: By quitting, you protect your baby's health and your own.   Hospitalized patients: By quitting, you reduce health problems and help healing.   Heart attack patients: By quitting, you reduce your risk of a second heart attack.   Lung, head, and neck cancer patients: By quitting, you reduce your chance of a second cancer.   Parents of children and adolescents: By quitting, you protect your children from illnesses caused by secondhand smoke.  QUESTIONS TO THINK ABOUT Think about the following questions before you try to stop smoking. You may want to talk about your answers with your caregiver.  Why do you want to quit?   If you tried to quit in the past, what helped and what did not?   What will be the most difficult situations for you after you quit? How will you plan to handle them?   Who can help you through the tough times? Your family? Friends? Caregiver?   What pleasures do you get from smoking? What ways can you still get pleasure if you quit?  Here are some questions to ask your caregiver:  How can you help me to be successful at quitting?   What medicine do you think would be best for me and how should I take it?   What should I do if I need more help?   What is smoking withdrawal like? How can I get information on withdrawal?  Quitting takes hard work and a lot of effort,  but you can quit smoking. FOR MORE INFORMATION  Smokefree.gov (http://www.smokefree.gov) provides free, accurate, evidence-based information and professional assistance to help support the immediate and long-term needs of people trying to quit smoking. Document Released: 12/25/2000 Document Revised: 12/20/2010 Document Reviewed: 10/17/2008 [employer] Patient Information 2012 [employer]. 

## 2011-04-18 NOTE — Progress Notes (Signed)
Subjective:    Patient ID: Laura Henson, female    DOB: Sep 30, 1975, 36 y.o.   MRN: [id number]  HPI  Patient seen for complete physical. She has past medical history significant for hyperlipidemia, GERD, migraine headaches. Very strong family history of coronary artery disease in both parents. Both parents had CAD issues around age 49. They were both smokers. Patient is vegetarian and very careful about her diet-low saturated fat. She is still smoking and hopes to quit. She's previously tried Chantix but had some side effects. Has never tried Wellbutrin but would like to consider.  Tetanus is up-to-date. Previous hysterectomy around age 73 secondary to severe pain from endometriosis. No indication for Pap smears. She had total abdominal hysterectomy.  She takes multivitamin and no other consistent calcium or vitamin D. She had DEXA scan several years ago we cannot find record of that. She was never placed on estrogen replacement except for briefly following her hysterectomy.  History of hyperlipidemia. At one point she was on Lipitor. She does not recall any prior side effects.  She has frequent abdominal symptoms of bloating and constipation and intermittent diarrhea. Probable IBS. Previously taken medications such as Librax with some success. Good fiber intake. No recent bloody stools. No appetite or weight changes.  Past Medical History  Diagnosis Date  . MIGRAINE HEADACHE 11/27/2009  . 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 GERD 10/09/2006  . HYPERLIPIDEMIA 10/09/2006  . PONV (postoperative nausea and vomiting)   . Rheumatic fever     41 yrs old  . Chronic kidney disease     stone hx  . Headache     migraines hx  . Endometriosis    Past Surgical History  Procedure Date  . Appendectomy 1996  . Cesarean section   . Hemoroidectomy     scar tissue  . Abdominal hysterectomy     DUB after childbirth, endometriosis  . Oophorectomy   . Abdominal hysterectomy   . Cesarean section     . Cholecystectomy   . Colon surgery     lsft hemicoloning-endometriosis    reports that she has been smoking Cigarettes.  She has a 3 pack-year smoking history. She does not have any smokeless tobacco history on file. She reports that she does not drink alcohol or use illicit drugs. family history includes Cancer (age of onset:60) in her father; Heart disease in her father and mother; and Hypertension in her father and mother. No Known Allergies    Review of Systems  Constitutional: Negative for fever, activity change, appetite change, fatigue and unexpected weight change.  HENT: Negative for hearing loss, ear pain, sore throat and trouble swallowing.   Eyes: Negative for visual disturbance.  Respiratory: Negative for cough and shortness of breath.   Cardiovascular: Negative for chest pain and palpitations.  Gastrointestinal: Positive for diarrhea and constipation. Negative for nausea, vomiting, abdominal pain and blood in stool.  Genitourinary: Negative for dysuria and hematuria.  Musculoskeletal: Negative for myalgias, back pain and arthralgias.  Skin: Negative for rash.  Neurological: Negative for dizziness, syncope, weakness and headaches.  Hematological: Negative for adenopathy. Does not bruise/bleed easily.  Psychiatric/Behavioral: Negative for confusion and dysphoric mood.       Objective:   Physical Exam  Constitutional: She is oriented to person, place, and time. She appears well-developed and well-nourished.  HENT:  Head: Normocephalic and atraumatic.  Eyes: EOM are normal. Pupils are equal, round, and reactive to light.  Neck: Normal range of motion. Neck supple. No thyromegaly present.  Cardiovascular: Normal rate, regular rhythm and normal heart sounds.   No murmur heard. Pulmonary/Chest: Breath sounds normal. No respiratory distress. She has no wheezes. She has no rales.  Abdominal: Soft. Bowel sounds are normal. She exhibits no distension and no mass. There is no  tenderness. There is no rebound and no guarding.  Genitourinary:       Breasts are symmetric with no mass Pelvic deferred secondary to hysterectomy for benign disease  Musculoskeletal: Normal range of motion. She exhibits no edema.  Lymphadenopathy:    She has no cervical adenopathy.  Neurological: She is alert and oriented to person, place, and time. She displays normal reflexes. No cranial nerve deficit.  Skin: No rash noted.       Patient has benign-appearing wart just inside the right naris. This has the classic verrucous appearance. Nonpigmented.  Psychiatric: She has a normal mood and affect. Her behavior is normal. Judgment and thought content normal.          Assessment & Plan:  #1 Complete physical. Wellness issues addressed. Discussed adequate calcium and vitamin D. Smoking cessation discussed. Schedule mammogram and DEXA scan #2 Ongoing nicotine use. Smoking cessation discussed. Trial of Wellbutrin XL 300 mg once daily and consider stopping smoking within 2 weeks after starting medication  #3 probable IBS. Continue high fiber intake. When necessary use of Librax #4 hyperlipidemia. Very strong family history of premature CAD. Start Lipitor 20 mg daily and recheck lipid and hepatic in 6-8 weeks #5 common wart right naris. Discussed risks and benefits of treatment with liquid nitrogen including risk of blistering, pain, and risk of secondary infection and patient consents.  Treated with liquid nitroglycerin without difficulty. Touch base 2 weeks if not resolving

## 2011-05-14 ENCOUNTER — Other Ambulatory Visit: Payer: Self-pay | Admitting: Family Medicine

## 2011-05-14 DIAGNOSIS — M549 Dorsalgia, unspecified: Secondary | ICD-10-CM

## 2011-05-14 DIAGNOSIS — Z1231 Encounter for screening mammogram for malignant neoplasm of breast: Secondary | ICD-10-CM

## 2011-05-14 DIAGNOSIS — Z90710 Acquired absence of both cervix and uterus: Secondary | ICD-10-CM

## 2011-05-23 ENCOUNTER — Other Ambulatory Visit: Payer: Self-pay | Admitting: *Deleted

## 2011-05-23 DIAGNOSIS — Z90710 Acquired absence of both cervix and uterus: Secondary | ICD-10-CM

## 2011-05-29 ENCOUNTER — Other Ambulatory Visit: Payer: Managed Care, Other (non HMO)

## 2011-05-29 ENCOUNTER — Ambulatory Visit: Payer: Managed Care, Other (non HMO)

## 2011-06-05 ENCOUNTER — Emergency Department
Admission: EM | Admit: 2011-06-05 | Discharge: 2011-06-05 | Disposition: A | Discharge status: Home / Self Care | Payer: Managed Care, Other (non HMO) | Source: Home / Self Care

## 2011-06-05 ENCOUNTER — Encounter: Payer: Self-pay | Admitting: *Deleted

## 2011-06-05 DIAGNOSIS — R059 Cough, unspecified: Secondary | ICD-10-CM

## 2011-06-05 DIAGNOSIS — R05 Cough: Secondary | ICD-10-CM

## 2011-06-05 DIAGNOSIS — J069 Acute upper respiratory infection, unspecified: Secondary | ICD-10-CM

## 2011-06-05 MED ORDER — AZITHROMYCIN 250 MG PO TABS: ORAL_TABLET | ORAL | Status: AC

## 2011-06-05 MED ORDER — PREDNISONE (PAK) 10 MG PO TABS: 10.0000 mg | ORAL_TABLET | Freq: Every day | ORAL | Status: AC

## 2011-06-05 MED ORDER — GUAIFENESIN-CODEINE 100-10 MG/5ML PO SYRP: 5.0000 mL | ORAL_SOLUTION | Freq: Four times a day (QID) | ORAL | Status: AC | PRN

## 2011-06-05 NOTE — ED Provider Notes (Signed)
History     CSN: [id number]  Arrival date & time 06/05/11  1018   None     Chief Complaint  Patient presents with  . Cough  . Wheezing    (Consider location/radiation/quality/duration/timing/severity/associated sxs/prior treatment) HPI Laura Henson is a 36 y.o. female who complains of onset of cold symptoms for 2 weeks.  The symptoms are constant and mild-moderate in severity.  She quit smoking about 6 weeks ago. + sore throat +cough No pleuritic pain No wheezing +nasal congestion + post-nasal drainage + sinus pain/pressure + chest congestion No itchy/red eyes No earache No hemoptysis No SOB + chills/sweats No nausea No vomiting No abdominal pain No diarrhea No skin rashes No fatigue No myalgias No headache    Past Medical History  Diagnosis Date  . MIGRAINE HEADACHE 11/27/2009  . 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 GERD 10/09/2006  . HYPERLIPIDEMIA 10/09/2006  . PONV (postoperative nausea and vomiting)   . Rheumatic fever     32 yrs old  . Chronic kidney disease     stone hx  . Headache     migraines hx  . Endometriosis     Past Surgical History  Procedure Date  . Appendectomy 1996  . Cesarean section   . Hemoroidectomy     scar tissue  . Abdominal hysterectomy     DUB after childbirth, endometriosis  . Oophorectomy   . Abdominal hysterectomy   . Cesarean section   . Cholecystectomy   . Colon surgery     lsft hemicoloning-endometriosis  . Tonsillectomy     Family History  Problem Relation Age of Onset  . Heart disease Mother   . Hypertension Mother   . Heart disease Father   . Cancer Father 60    lung  . Hypertension Father     History  Substance Use Topics  . Smoking status: Former Smoker -- 0.5 packs/day for 6 years    Types: Cigarettes  . Smokeless tobacco: Not on file  . Alcohol Use: No    OB History    Grav Para Term Preterm Abortions TAB SAB Ect Mult Living                  Review of Systems  All other systems reviewed and are  negative.    Allergies  Review of patient's allergies indicates no known allergies.  Home Medications   Current Outpatient Rx  Name Route Sig Dispense Refill  . GEMFIBROZIL 600 MG PO TABS Oral Take 600 mg by mouth 2 (two) times daily before a meal.    . RISAQUAD PO CAPS Oral Take 2 capsules by mouth daily.    . ATORVASTATIN CALCIUM 20 MG PO TABS Oral Take 1 tablet (20 mg total) by mouth daily. 30 tablet 11  . AZITHROMYCIN 250 MG PO TABS  Use as directed 1 each 0  . BUPROPION HCL ER (XL) 300 MG PO TB24 Oral Take 1 tablet (300 mg total) by mouth daily. 30 tablet 5  . BUTALBITAL-APAP-CAFFEINE 50-325-40 MG PO TABS      . CLINDINIUM-CHLORDIAZEPOXIDE 2.5-5 MG PO CAPS Oral Take 1 capsule by mouth 3 (three) times daily as needed. 60 capsule 3  . ELETRIPTAN HYDROBROMIDE 40 MG PO TABS  One tablet by mouth as needed for migraine headache.  If the headache improves and then returns, dose may be repeated after 2 hours have elapsed since first dose (do not exceed 80 mg per day). may repeat in 2 hours if necessary 10 tablet 0  One at onset of migraine, may repeat one tab in 2  ...  . GUAIFENESIN-CODEINE 100-10 MG/5ML PO SYRP Oral Take 5 mLs by mouth 4 (four) times daily as needed for cough or congestion. 120 mL 0  . WOMENS MULTI VITAMIN & MINERAL PO Oral Take by mouth daily.      Marland Kitchen FISH OIL PO Oral Take 3 capsules by mouth daily.    . OXYCODONE-ACETAMINOPHEN 10-325 MG PO TABS Oral Take 1 tablet by mouth every 6 (six) hours as needed for pain. 20 tablet 0  . PREDNISONE (PAK) 10 MG PO TABS Oral Take 1 tablet (10 mg total) by mouth daily. 6 day pack, use as directed, Disp 1 pack 21 tablet 0  . PROCHLORPERAZINE MALEATE 10 MG PO TABS Oral Take 10 mg by mouth every 6 (six) hours as needed. For nausea and vomiting     . SUMATRIPTAN SUCCINATE 6 MG/0.5ML [place] SOLN  as needed.     . TRAZODONE HCL 150 MG PO TABS Oral Take 150 mg by mouth at bedtime as needed. For sleep      BP 145/61  Pulse 92  Temp(Src) 98.4  F (36.9 C) (Oral)  Resp 16  Ht 5\' 11"  (1.803 m)  Wt 138 lb (62.596 kg)  BMI 19.25 kg/m2  SpO2 98%  Physical Exam  Nursing note and vitals reviewed. Constitutional: She is oriented to person, place, and time. She appears well-developed and well-nourished.  HENT:  Head: Normocephalic and atraumatic.  Right Ear: Tympanic membrane, external ear and ear canal normal.  Left Ear: Tympanic membrane, external ear and ear canal normal.  Nose: Mucosal edema and rhinorrhea present.  Mouth/Throat: Posterior oropharyngeal erythema (+ clear post nasal drip, minimal erythema) present. No oropharyngeal exudate or posterior oropharyngeal edema.  Eyes: No scleral icterus.  Neck: Neck supple.  Cardiovascular: Regular rhythm and normal heart sounds.   Pulmonary/Chest: Effort normal. No respiratory distress. She has no decreased breath sounds. She has wheezes (scattered bilateral). She has rhonchi (scattered bilateral).  Neurological: She is alert and oriented to person, place, and time.  Skin: Skin is warm and dry.  Psychiatric: She has a normal mood and affect. Her speech is normal.    ED Course  Procedures (including critical care time)  Labs Reviewed - No data to display No results found.   1. Cough   2. Acute upper respiratory infections of unspecified site       MDM  1)  Take the prescribed antibiotic as instructed.  Rx for Prednisone and Cheratussin also given.  If not improving, consider CXR or increasing ABX to Levaquin. 2)  Use nasal saline solution (over the counter) at least 3 times a day. 3)  Use over the counter decongestants like Zyrtec-D every 12 hours as needed to help with congestion.  If you have hypertension, do not take medicines with sudafed.  4)  Can take tylenol every 6 hours or motrin every 8 hours for pain or fever. 5)  Follow up with your primary doctor if no improvement in 5-7 days, sooner if increasing pain, fever, or new symptoms.     Marlaine Hind,  MD 06/05/11 1041

## 2011-06-05 NOTE — ED Notes (Signed)
Pt c/o head/chest congestion, wheezing,and cough x 2 wks. She also c/o fever 101 last night. She has taken Robt, mucomyst, and nyquil with no relief.

## 2011-06-11 ENCOUNTER — Ambulatory Visit
Admission: RE | Admit: 2011-06-11 | Discharge: 2011-06-11 | Disposition: A | Discharge status: Home / Self Care | Payer: Managed Care, Other (non HMO) | Source: Ambulatory Visit | Attending: Family Medicine | Admitting: Family Medicine

## 2011-06-11 DIAGNOSIS — Z1231 Encounter for screening mammogram for malignant neoplasm of breast: Secondary | ICD-10-CM

## 2011-08-18 ENCOUNTER — Other Ambulatory Visit: Payer: Self-pay | Admitting: Family Medicine

## 2011-09-02 ENCOUNTER — Encounter: Payer: Self-pay | Admitting: Family Medicine

## 2011-09-02 ENCOUNTER — Ambulatory Visit (INDEPENDENT_AMBULATORY_CARE_PROVIDER_SITE_OTHER): Payer: Managed Care, Other (non HMO) | Admitting: Family Medicine

## 2011-09-02 VITALS — BP 100/68 | HR 74 | Temp 98.5°F | Wt 140.0 lb

## 2011-09-02 DIAGNOSIS — R5381 Other malaise: Secondary | ICD-10-CM

## 2011-09-02 DIAGNOSIS — R5383 Other fatigue: Secondary | ICD-10-CM

## 2011-09-02 DIAGNOSIS — R531 Weakness: Secondary | ICD-10-CM

## 2011-09-02 DIAGNOSIS — R296 Repeated falls: Secondary | ICD-10-CM

## 2011-09-02 DIAGNOSIS — Z9181 History of falling: Secondary | ICD-10-CM

## 2011-09-02 LAB — BASIC METABOLIC PANEL
CO2: 28 mEq/L (ref 19–32)
Calcium: 9.9 mg/dL (ref 8.4–10.5)
Chloride: 105 mEq/L (ref 96–112)
Glucose, Bld: 100 mg/dL — ABNORMAL HIGH (ref 70–99)
Potassium: 4 mEq/L (ref 3.5–5.1)
Sodium: 140 mEq/L (ref 135–145)

## 2011-09-02 NOTE — Progress Notes (Signed)
Subjective:    Patient ID: Laura Henson, female    DOB: [birth year], 36 y.o.   MRN: [id number]  HPI  Patient has history of hyperlipidemia, GERD, migraine headaches. She presents today with increasing fatigue over the past several weeks. She is generally sleeping well- usually 8-9 hours at night. She's had history of insomnia issues the past but currently not an issue. Denies depression.  Describes a couple episodes where she had sudden generalized weakness without altered consciousness and fell down. She has abrasions over upper and lower extremities. No confusion or mental status changes. no headaches. no visual changes. no focal weakness.  Patient describes a" foggy feeling". She also describes some intermittent numbness involving hands and feet bilaterally. This is also episodic and can sometimes last for 2 hours and does not appear to be positional. She has had 3 episodes of episodic weakness where she was walking along and fell. She again denies any focal weakness. No history of narcolepsy. No history of seizures. She has always remained alert.  Patient has good appetite. No weight changes. She's been a vegetarian for a few years. Does not take any consistent B12 supplementation. Recent labs for physical including thyroid function unremarkable. She does take Lipitor for hyperlipidemia but has been off this for over one month. No history of associated myalgia.  Past Medical History  Diagnosis Date  . MIGRAINE HEADACHE 11/27/2009  . 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 GERD 10/09/2006  . HYPERLIPIDEMIA 10/09/2006  . PONV (postoperative nausea and vomiting)   . Rheumatic fever     53 yrs old  . Chronic kidney disease     stone hx  . Headache     migraines hx  . Endometriosis    Past Surgical History  Procedure Date  . Appendectomy 1996  . Cesarean section   . Hemoroidectomy     scar tissue  . Abdominal hysterectomy     DUB after childbirth, endometriosis  . Oophorectomy   .  Abdominal hysterectomy   . Cesarean section   . Cholecystectomy   . Colon surgery     lsft hemicoloning-endometriosis  . Tonsillectomy     reports that she has quit smoking. Her smoking use included Cigarettes. She has a 3 pack-year smoking history. She does not have any smokeless tobacco history on file. She reports that she does not drink alcohol or use illicit drugs. family history includes Cancer (age of onset:60) in her father; Heart disease in her father and mother; and Hypertension in her father and mother. No Known Allergies    Review of Systems  Constitutional: Positive for fatigue.  HENT: Negative for trouble swallowing.   Respiratory: Negative for cough and shortness of breath.   Cardiovascular: Negative for chest pain, palpitations and leg swelling.  Gastrointestinal: Negative for abdominal pain.  Genitourinary: Negative for dysuria.  Neurological: Positive for weakness and numbness. Negative for dizziness, tremors, seizures, syncope, speech difficulty, light-headedness and headaches.  Hematological: Negative for adenopathy.  Psychiatric/Behavioral: Negative for hallucinations, confusion, disturbed wake/sleep cycle and agitation. The patient is not nervous/anxious.        Objective:   Physical Exam  Constitutional: She is oriented to person, place, and time. She appears well-developed and well-nourished.  HENT:  Mouth/Throat: Oropharynx is clear and moist.  Eyes: Pupils are equal, round, and reactive to light.  Neck: Normal range of motion. Neck supple. No thyromegaly present.  Cardiovascular: Normal rate and regular rhythm.  Exam reveals no gallop.   No murmur heard. Pulmonary/Chest: Effort  normal and breath sounds normal. No respiratory distress. She has no wheezes. She has no rales.  Musculoskeletal: She exhibits no edema.  Lymphadenopathy:    She has no cervical adenopathy.  Neurological: She is alert and oriented to person, place, and time. She has normal  reflexes. No cranial nerve deficit.       Gait normal. Celebratory function normal. No focal strength deficits. Babinski's downgoing bilaterally. Romberg normal  Skin: No rash noted.       Abrasions involving both knees and right elbow  Psychiatric: She has a normal mood and affect. Her behavior is normal.          Assessment & Plan:  Recent frequent falls and episodic weakness. No evidence for seizure history. No history of narcolepsy. Etiology unclear. She also describes intermittent paresthesias bilateral involving hands and feet. Check B12 level as patient is vegetarian -though doubt this will be deficient. Check basic metabolic panel and sedimentation rate. Consider neurology referral if lab work unremarkable

## 2011-12-23 ENCOUNTER — Telehealth: Payer: Self-pay | Admitting: Family Medicine

## 2011-12-23 NOTE — Telephone Encounter (Signed)
Message left on pt VM to clarify their request

## 2011-12-23 NOTE — Telephone Encounter (Signed)
Last acute OV August, 2013

## 2011-12-23 NOTE — Telephone Encounter (Signed)
Please clarify. Chantix and nicotine patches are completely different. Nicotine patches are over-the-counter and do not require prescription

## 2011-12-23 NOTE — Telephone Encounter (Signed)
Pt is requesting nicotine patches(chantix) call into [facility] . Pt is trying to stop smoking.

## 2012-03-10 ENCOUNTER — Other Ambulatory Visit: Payer: Self-pay | Admitting: *Deleted

## 2012-03-10 MED ORDER — CILIDINIUM-CHLORDIAZEPOXIDE 2.5-5 MG PO CAPS: 1.0000 | ORAL_CAPSULE | Freq: Three times a day (TID) | ORAL | Status: DC | PRN

## 2012-04-22 ENCOUNTER — Encounter: Payer: Managed Care, Other (non HMO) | Admitting: Family Medicine

## 2012-04-23 ENCOUNTER — Other Ambulatory Visit (INDEPENDENT_AMBULATORY_CARE_PROVIDER_SITE_OTHER): Payer: Managed Care, Other (non HMO)

## 2012-04-23 DIAGNOSIS — Z0000 Encounter for general adult medical examination without abnormal findings: Secondary | ICD-10-CM

## 2012-04-23 DIAGNOSIS — E785 Hyperlipidemia, unspecified: Secondary | ICD-10-CM

## 2012-04-23 LAB — BASIC METABOLIC PANEL
BUN: 12 mg/dL (ref 6–23)
CO2: 28 mEq/L (ref 19–32)
Calcium: 9.9 mg/dL (ref 8.4–10.5)
Creatinine, Ser: 0.7 mg/dL (ref 0.4–1.2)
GFR: 104.65 mL/min (ref >60.00)
Glucose, Bld: 96 mg/dL (ref 70–99)
Sodium: 139 mEq/L (ref 135–145)

## 2012-04-23 LAB — CBC WITH DIFFERENTIAL/PLATELET
Basophils Absolute: 0 10*3/uL (ref 0.0–0.1)
Eosinophils Absolute: 0.2 10*3/uL (ref 0.0–0.7)
Hemoglobin: 13.6 g/dL (ref 12.0–15.0)
Lymphocytes Relative: 34.6 % (ref 12.0–46.0)
MCHC: 33.3 g/dL (ref 30.0–36.0)
Monocytes Relative: 4 % (ref 3.0–12.0)
Neutro Abs: 3.1 10*3/uL (ref 1.4–7.7)
Neutrophils Relative %: 56.5 % (ref 43.0–77.0)
Platelets: 188 10*3/uL (ref 150.0–400.0)
RDW: 13.8 % (ref 11.5–14.6)

## 2012-04-23 LAB — POCT URINALYSIS DIPSTICK
Bilirubin, UA: NEGATIVE
Glucose, UA: NEGATIVE
Nitrite, UA: NEGATIVE
Spec Grav, UA: <=1.005
Urobilinogen, UA: 0.2

## 2012-04-23 LAB — LIPID PANEL
HDL: 59.3 mg/dL (ref >39.00)
Total CHOL/HDL Ratio: 4
Triglycerides: 81 mg/dL (ref 0.0–149.0)
VLDL: 16.2 mg/dL (ref 0.0–40.0)

## 2012-04-23 LAB — HEPATIC FUNCTION PANEL
AST: 24 U/L (ref 0–37)
Albumin: 4.4 g/dL (ref 3.5–5.2)
Alkaline Phosphatase: 68 U/L (ref 39–117)
Total Bilirubin: 0.3 mg/dL (ref 0.3–1.2)

## 2012-04-23 LAB — LDL CHOLESTEROL, DIRECT: Direct LDL: 140.5 mg/dL

## 2012-04-23 LAB — TSH: TSH: 0.27 u[IU]/mL — ABNORMAL LOW (ref 0.35–5.50)

## 2012-04-30 ENCOUNTER — Encounter: Payer: Self-pay | Admitting: Family Medicine

## 2012-04-30 ENCOUNTER — Ambulatory Visit (INDEPENDENT_AMBULATORY_CARE_PROVIDER_SITE_OTHER): Payer: Managed Care, Other (non HMO) | Admitting: Family Medicine

## 2012-04-30 VITALS — BP 120/80 | HR 72 | Temp 98.7°F | Resp 12 | Ht 70.0 in | Wt 140.0 lb

## 2012-04-30 DIAGNOSIS — E8941 Symptomatic postprocedural ovarian failure: Secondary | ICD-10-CM

## 2012-04-30 DIAGNOSIS — R946 Abnormal results of thyroid function studies: Secondary | ICD-10-CM

## 2012-04-30 DIAGNOSIS — Z0000 Encounter for general adult medical examination without abnormal findings: Secondary | ICD-10-CM

## 2012-04-30 DIAGNOSIS — G4700 Insomnia, unspecified: Secondary | ICD-10-CM

## 2012-04-30 DIAGNOSIS — F5104 Psychophysiologic insomnia: Secondary | ICD-10-CM

## 2012-04-30 DIAGNOSIS — L708 Other acne: Secondary | ICD-10-CM

## 2012-04-30 DIAGNOSIS — L709 Acne, unspecified: Secondary | ICD-10-CM

## 2012-04-30 DIAGNOSIS — R7989 Other specified abnormal findings of blood chemistry: Secondary | ICD-10-CM

## 2012-04-30 DIAGNOSIS — E785 Hyperlipidemia, unspecified: Secondary | ICD-10-CM

## 2012-04-30 DIAGNOSIS — E8940 Asymptomatic postprocedural ovarian failure: Secondary | ICD-10-CM

## 2012-04-30 LAB — TSH: TSH: 0.69 u[IU]/mL (ref 0.35–5.50)

## 2012-04-30 LAB — T4, FREE: Free T4: 0.71 ng/dL (ref 0.60–1.60)

## 2012-04-30 MED ORDER — CLINDAMYCIN PHOSPHATE 1 % EX GEL: Freq: Two times a day (BID) | CUTANEOUS | Status: DC

## 2012-04-30 MED ORDER — ESTROGENS CONJUGATED 0.625 MG PO TABS: 0.6250 mg | ORAL_TABLET | Freq: Every day | ORAL | Status: DC

## 2012-04-30 MED ORDER — ALPRAZOLAM 1 MG PO TABS: 1.0000 mg | ORAL_TABLET | Freq: Every evening | ORAL | Status: DC | PRN

## 2012-04-30 NOTE — Progress Notes (Signed)
Subjective:    Patient ID: Laura Henson, female    DOB: March 06, 1975, 37 y.o.   MRN: [id number]  HPI Patient here for complete physical. Chronic problems include history of migraine headaches, GERD, hyperlipidemia, reported rheumatic fever without mention of heart valve complications, irritable bowel syndrome, chronic insomnia, and endometriosis.  Patient quit smoking several months ago. She is running about 40 miles per week. Hx of IBS.  Still has fairly frequent loose stools. She had hemicolectomy when she had endometriosis surgery several years ago. She had total hysterectomy 1996-endometriosis. She was on estrogen for some time but stopped this on her own. She's had severe insomnia-usually about 3 hours sleep per night. She attributes some of this to current stresses. Has tried Benadryl and melatonin without relief. She had sleep walking with Ambien. Briefly took Xanax which did work well in past.  Tetanus is up-to-date.  Past Medical History  Diagnosis Date  . MIGRAINE HEADACHE 11/27/2009  . 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 GERD 10/09/2006  . HYPERLIPIDEMIA 10/09/2006  . PONV (postoperative nausea and vomiting)   . Rheumatic fever     12 yrs old  . Chronic kidney disease     stone hx  . Headache     migraines hx  . Endometriosis    Past Surgical History  Procedure Laterality Date  . Appendectomy  1996  . Cesarean section    . Hemoroidectomy      scar tissue  . Abdominal hysterectomy      DUB after childbirth, endometriosis  . Oophorectomy    . Abdominal hysterectomy    . Cesarean section    . Cholecystectomy    . Colon surgery      lsft hemicoloning-endometriosis  . Tonsillectomy      reports that she has quit smoking. Her smoking use included Cigarettes. She has a 3 pack-year smoking history. She does not have any smokeless tobacco history on file. She reports that she does not drink alcohol or use illicit drugs. family history includes Cancer (age of onset: 19)  in her father; Heart disease in her father; Heart disease (age of onset: 8) in her mother; and Hypertension in her father and mother. No Known Allergies    Review of Systems  Constitutional: Negative for fever, activity change, appetite change, fatigue and unexpected weight change.  HENT: Negative for hearing loss, ear pain, sore throat and trouble swallowing.   Eyes: Negative for visual disturbance.  Respiratory: Negative for cough and shortness of breath.   Cardiovascular: Negative for chest pain and palpitations.  Gastrointestinal: Negative for abdominal pain, diarrhea, constipation and blood in stool.  Genitourinary: Negative for dysuria and hematuria.  Musculoskeletal: Negative for myalgias, back pain and arthralgias.  Skin: Negative for rash.  Neurological: Negative for dizziness, syncope and headaches.  Hematological: Negative for adenopathy.  Psychiatric/Behavioral: Positive for sleep disturbance. Negative for confusion and dysphoric mood.       Objective:   Physical Exam  Constitutional: She is oriented to person, place, and time. She appears well-developed and well-nourished.  HENT:  Head: Normocephalic and atraumatic.  Eyes: EOM are normal. Pupils are equal, round, and reactive to light.  Neck: Normal range of motion. Neck supple. No thyromegaly present.  Cardiovascular: Normal rate, regular rhythm and normal heart sounds.   No murmur heard. Pulmonary/Chest: Breath sounds normal. No respiratory distress. She has no wheezes. She has no rales.  Abdominal: Soft. Bowel sounds are normal. She exhibits no distension and no mass. There is no tenderness.  There is no rebound and no guarding.  Musculoskeletal: Normal range of motion. She exhibits no edema.  Lymphadenopathy:    She has no cervical adenopathy.  Neurological: She is alert and oriented to person, place, and time. She displays normal reflexes. No cranial nerve deficit.  Skin: No rash noted.  Psychiatric: She has a  normal mood and affect. Her behavior is normal. Judgment and thought content normal.          Assessment & Plan:  #1 health maintenance. Labs discussed with patient. She has low TSH of uncertain significance-?subclinical hyperthyroidism. Hyperlipidemia. Exercising regularly. Quit smoking several months ago. Previous hysterectomy so no pap. #2 surgical post menopause. Because of her thin body habitus and being Caucasian and surgical menopause at early age- high risk for osteoporosis. Discussed adequate calcium and vitamin D. Continue weightbearing exercise. She has elected to go back on estrogen. Premarin 0.625 mg once daily. She had mammogram within the past year which was normal #3 chronic insomnia. Sleep hygiene discussed. Alprazolam 1 mg each bedtime as needed #4 positive family history of premature CAD. Her calculated 10 year risk for CAD is 1%. She's elected not to go on statins at this time #5 low TSH. Doubt clinical hyperthyroidism. Repeat TSH and free T4 #6 history of acne which was worse with estrogen in past. Clindamycin gel 0.1% twice daily

## 2012-04-30 NOTE — Patient Instructions (Addendum)
Insomnia Insomnia is frequent trouble falling and/or staying asleep. Insomnia can be a long term problem or a short term problem. Both are common. Insomnia can be a short term problem when the wakefulness is related to a certain stress or worry. Long term insomnia is often related to ongoing stress during waking hours and/or poor sleeping habits. Overtime, sleep deprivation itself can make the problem worse. Every little thing feels more severe because you are overtired and your ability to cope is decreased. CAUSES   Stress, anxiety, and depression.  Poor sleeping habits.  Distractions such as TV in the bedroom.  Naps close to bedtime.  Engaging in emotionally charged conversations before bed.  Technical reading before sleep.  Alcohol and other sedatives. They may make the problem worse. They can hurt normal sleep patterns and normal dream activity.  Stimulants such as caffeine for several hours prior to bedtime.  Pain syndromes and shortness of breath can cause insomnia.  Exercise late at night.  Changing time zones may cause sleeping problems (jet lag). It is sometimes helpful to have someone observe your sleeping patterns. They should look for periods of not breathing during the night (sleep apnea). They should also look to see how long those periods last. If you live alone or observers are uncertain, you can also be observed at a sleep clinic where your sleep patterns will be professionally monitored. Sleep apnea requires a checkup and treatment. Give your caregivers your medical history. Give your caregivers observations your family has made about your sleep.  SYMPTOMS   Not feeling rested in the morning.  Anxiety and restlessness at bedtime.  Difficulty falling and staying asleep. TREATMENT   Your caregiver may prescribe treatment for an underlying medical disorders. Your caregiver can give advice or help if you are using alcohol or other drugs for self-medication. Treatment  of underlying problems will usually eliminate insomnia problems.  Medications can be prescribed for short time use. They are generally not recommended for lengthy use.  Over-the-counter sleep medicines are not recommended for lengthy use. They can be habit forming.  You can promote easier sleeping by making lifestyle changes such as:  Using relaxation techniques that help with breathing and reduce muscle tension.  Exercising earlier in the day.  Changing your diet and the time of your last meal. No night time snacks.  Establish a regular time to go to bed.  Counseling can help with stressful problems and worry.  Soothing music and white noise may be helpful if there are background noises you cannot remove.  Stop tedious detailed work at least one hour before bedtime. HOME CARE INSTRUCTIONS   Keep a diary. Inform your caregiver about your progress. This includes any medication side effects. See your caregiver regularly. Take note of:  Times when you are asleep.  Times when you are awake during the night.  The quality of your sleep.  How you feel the next day. This information will help your caregiver care for you.  Get out of bed if you are still awake after 15 minutes. Read or do some quiet activity. Keep the lights down. Wait until you feel sleepy and go back to bed.  Keep regular sleeping and waking hours. Avoid naps.  Exercise regularly.  Avoid distractions at bedtime. Distractions include watching television or engaging in any intense or detailed activity like attempting to balance the household checkbook.  Develop a bedtime ritual. Keep a familiar routine of bathing, brushing your teeth, climbing into bed at the same   time each night, listening to soothing music. Routines increase the success of falling to sleep faster.  Use relaxation techniques. This can be using breathing and muscle tension release routines. It can also include visualizing peaceful scenes. You can  also help control troubling or intruding thoughts by keeping your mind occupied with boring or repetitive thoughts like the old concept of counting sheep. You can make it more creative like imagining planting one beautiful flower after another in your backyard garden.  During your day, work to eliminate stress. When this is not possible use some of the previous suggestions to help reduce the anxiety that accompanies stressful situations. MAKE SURE YOU:   Understand these instructions.  Will watch your condition.  Will get help right away if you are not doing well or get worse. Document Released: 12/29/1999 Document Revised: 03/25/2011 Document Reviewed: 01/28/2007 [facility] Patient Information 2013 [place], [place].  Calcium 1200 mg daily and Vit D [phone] IU daily

## 2012-05-01 DIAGNOSIS — G4700 Insomnia, unspecified: Secondary | ICD-10-CM | POA: Insufficient documentation

## 2012-05-18 ENCOUNTER — Encounter: Payer: Self-pay | Admitting: Family Medicine

## 2012-05-18 NOTE — Telephone Encounter (Signed)
This was a My Chart encounter and I made it a phone note.  Please advise.

## 2012-05-20 MED ORDER — ESZOPICLONE 3 MG PO TABS: 3.0000 mg | ORAL_TABLET | Freq: Every day | ORAL | Status: DC

## 2012-05-28 IMAGING — CR DG CHEST 2V
2 series · 2 of 2 positions shown · non-contrast
Comparison: None.

CLINICAL DATA: Dyspnea with shortness of breath and heart
palpitations.  Smoker

CHEST - 2 VIEW

[view not recorded (1 of 2)]
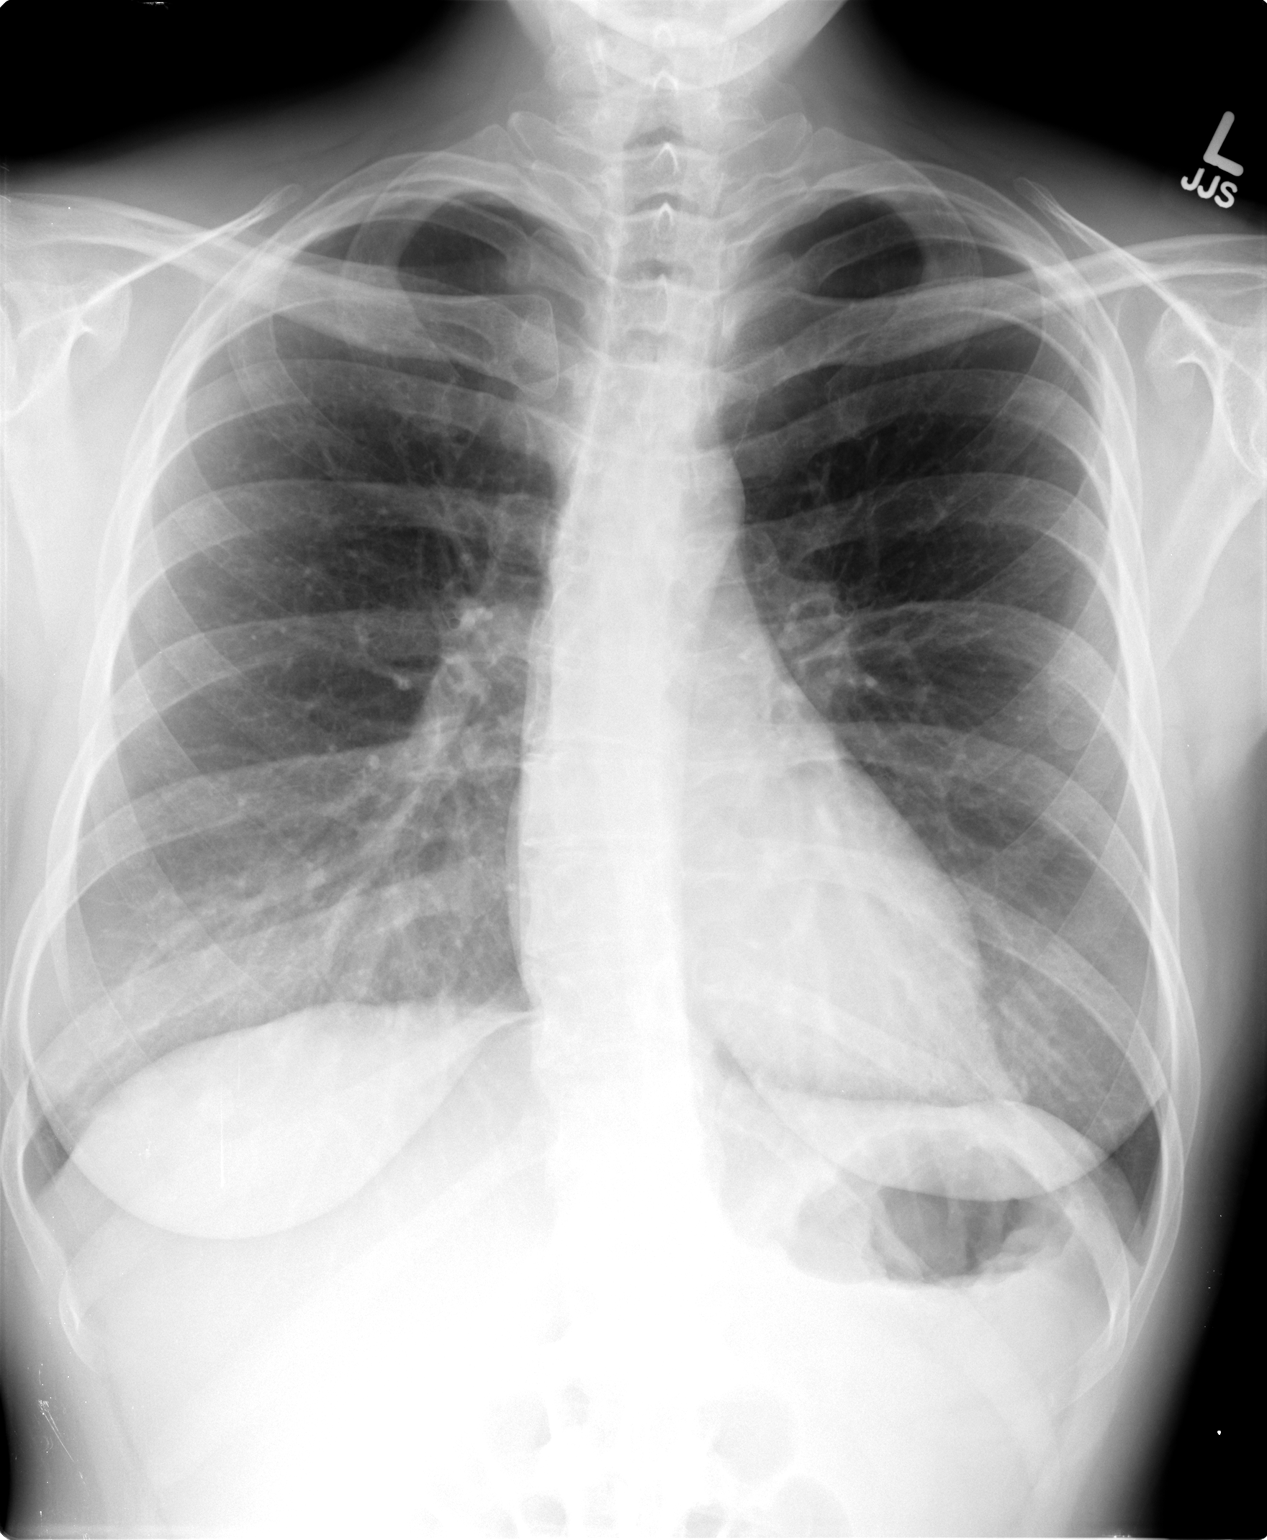

[view not recorded (2 of 2)]
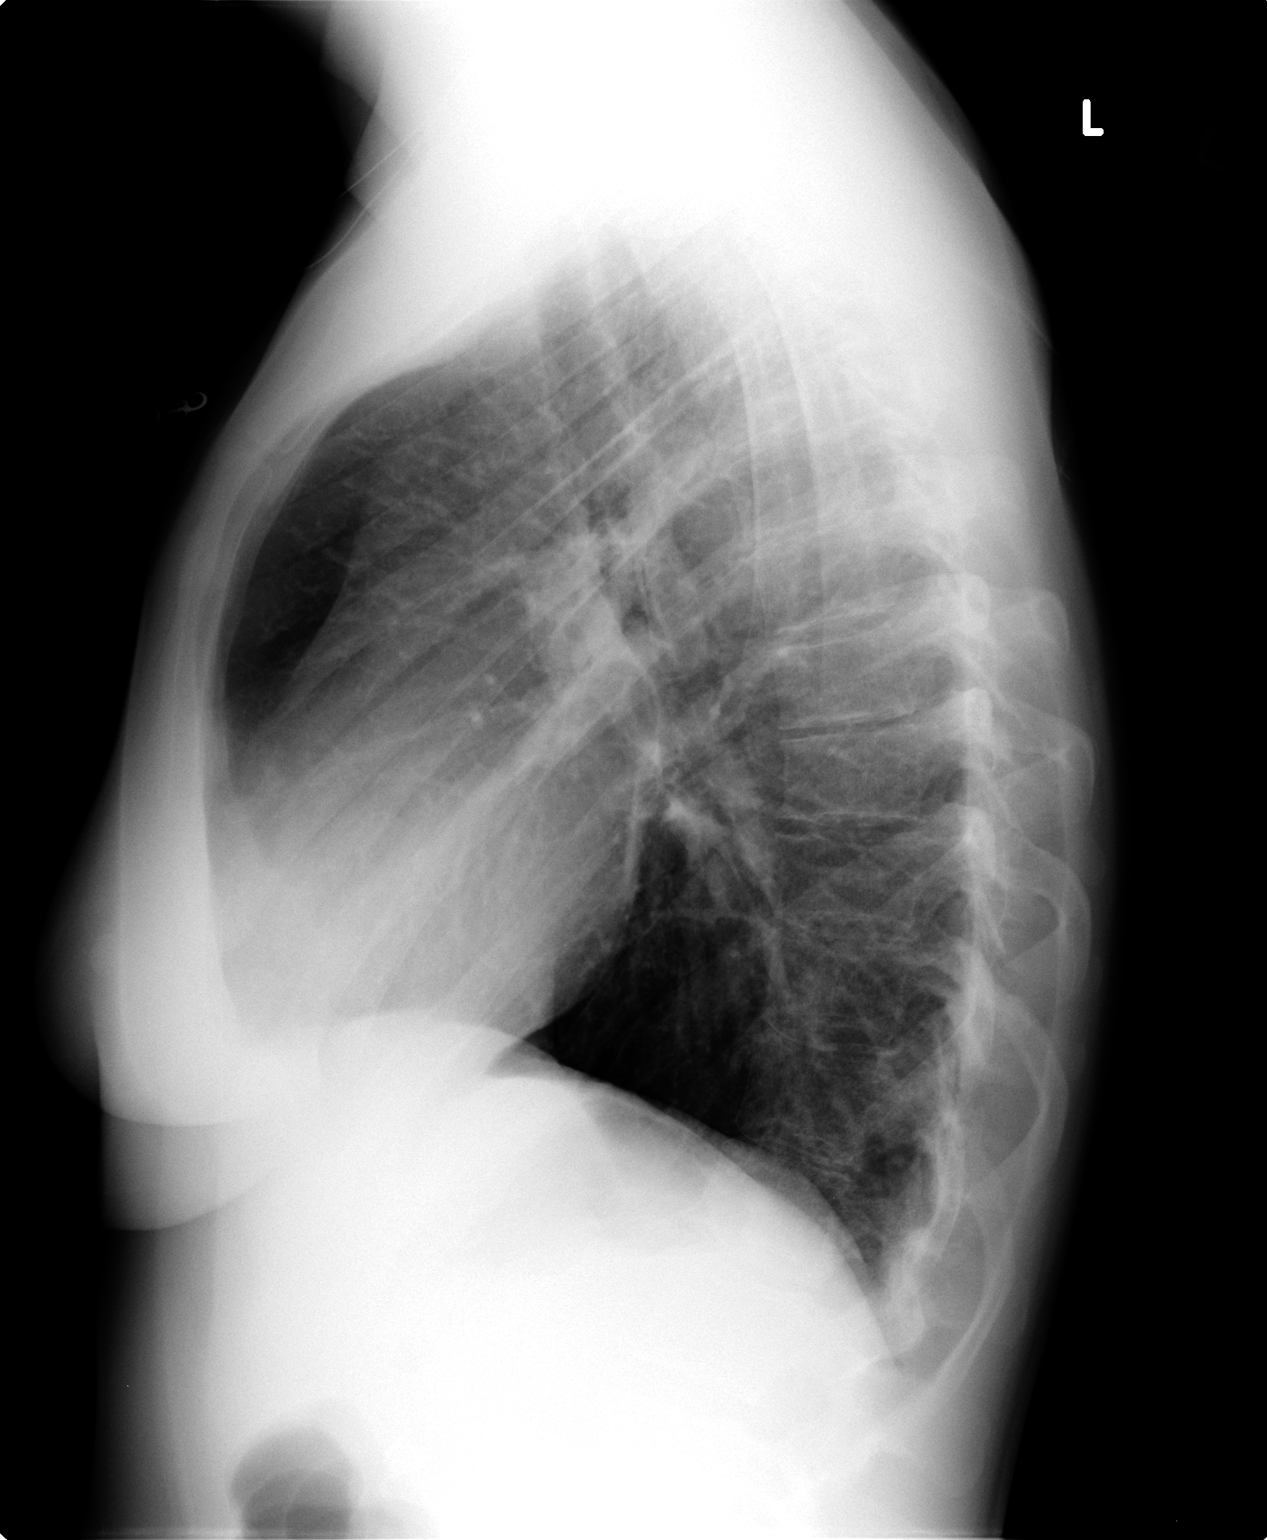

[2 of 2 positions shown; findings below may reference images not displayed]

FINDINGS: A mild pectus carina deformity and mild curvature of the
thoracic spine are seen.  Taking these findings into consideration,
the cardiomediastinal silhouette is within normal limits.

The lung fields are clear with no signs of focal infiltrate or
congestive failure.  No pleural fluid or peribronchial cuffing is
seen.

Bony structures appear otherwise intact.
IMPRESSION: No worrisome focal or acute cardiopulmonary abnormality suggested.

## 2012-06-04 ENCOUNTER — Encounter: Payer: Self-pay | Admitting: Family Medicine

## 2012-06-04 ENCOUNTER — Other Ambulatory Visit: Payer: Self-pay | Admitting: *Deleted

## 2012-06-04 MED ORDER — TRAZODONE HCL 150 MG PO TABS: 150.0000 mg | ORAL_TABLET | Freq: Every evening | ORAL | Status: DC | PRN

## 2012-07-31 ENCOUNTER — Telehealth: Payer: Self-pay

## 2012-07-31 NOTE — Telephone Encounter (Signed)
Trazodone 150mg  #60  Last refill 06-04-12 04-30-12 last visit

## 2012-08-02 NOTE — Telephone Encounter (Signed)
Refill for 6 months OK.

## 2012-08-03 MED ORDER — TRAZODONE HCL 150 MG PO TABS: 150.0000 mg | ORAL_TABLET | Freq: Every evening | ORAL | Status: DC | PRN

## 2012-08-03 NOTE — Telephone Encounter (Signed)
Sent refill to [facility]

## 2012-08-04 ENCOUNTER — Encounter: Payer: Self-pay | Admitting: Family Medicine

## 2012-08-27 ENCOUNTER — Encounter: Payer: Self-pay | Admitting: Family Medicine

## 2012-08-27 NOTE — Telephone Encounter (Signed)
Lets try diclofenac 75 mg by mouth twice a day when necessary, dispense #30 with no refill

## 2012-09-14 ENCOUNTER — Encounter: Payer: Self-pay | Admitting: Family Medicine

## 2012-09-14 IMAGING — CT CT ABD-PELV W/O CM
2 of 4 series · 17 of 46 positions shown, 19 images · non-contrast
Comparison: 02/12/2005

CLINICAL DATA: Right flank pain for 3 days

CT ABDOMEN AND PELVIS WITHOUT CONTRAST
TECHNIQUE: Multidetector CT imaging of the abdomen and pelvis was
performed following the standard protocol without intravenous
contrast.

[Series 2: 5mm stone study - 160 eff. mas · axial · 0.60mm/px · z∈[-450,-30]mm · 14 of 92 slices shown, 16 images]
[im 4/92  soft-tissue]
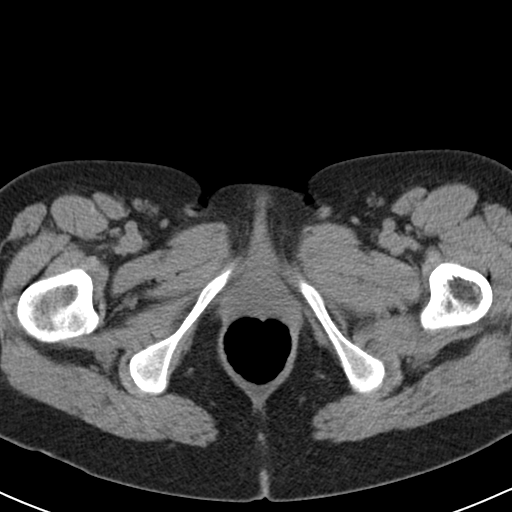
[im 4/92  bone]
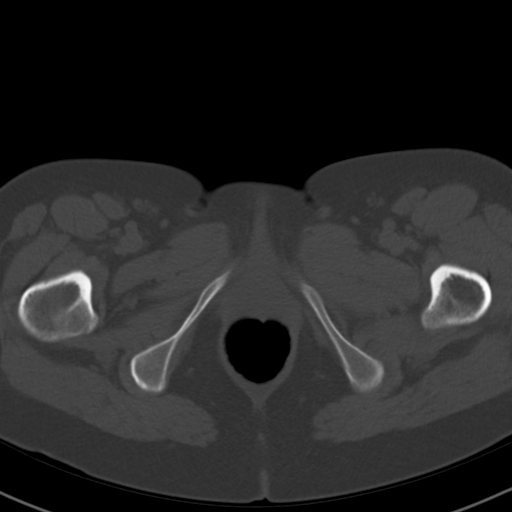
[im 11/92  soft-tissue]
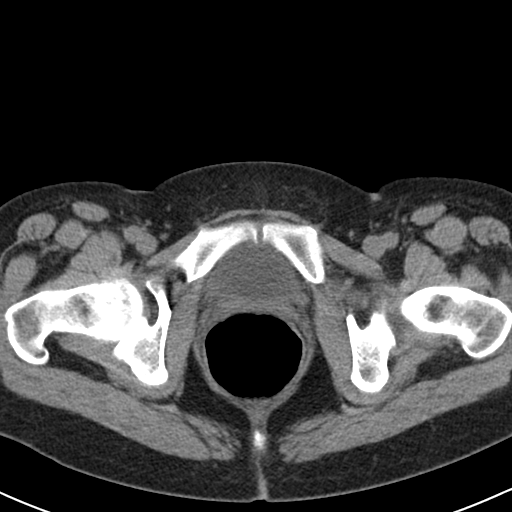
[im 19/92  soft-tissue]
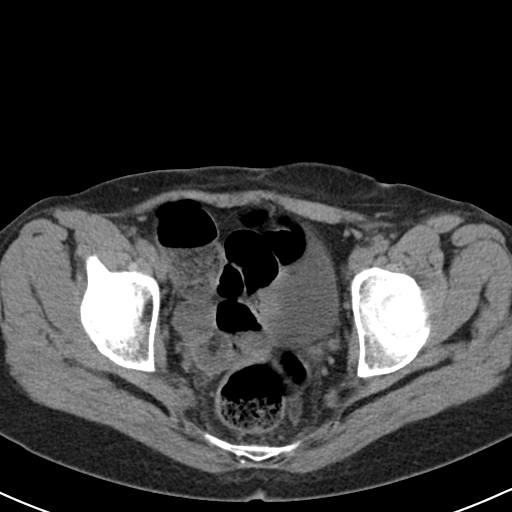
[im 26/92  soft-tissue]
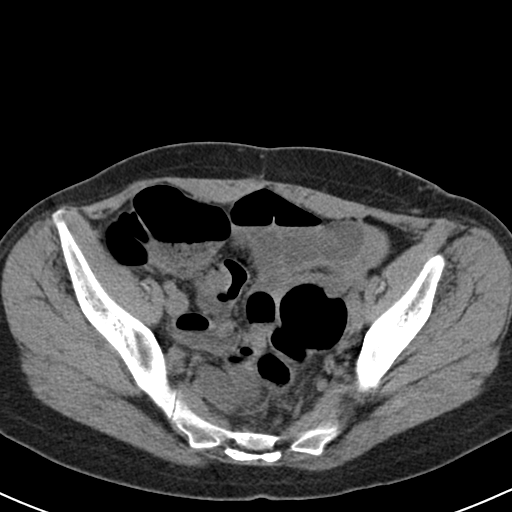
[im 30/92  soft-tissue]
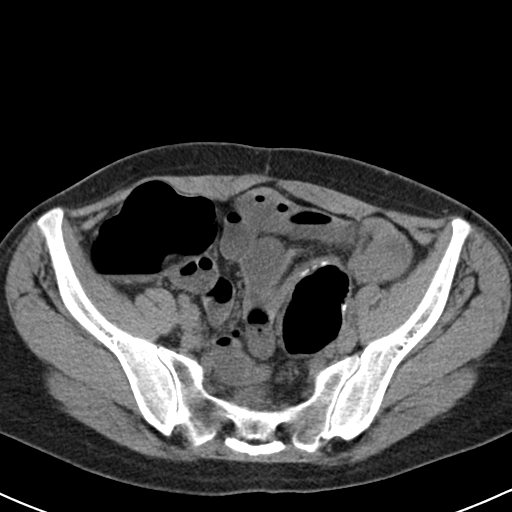
[im 37/92  soft-tissue]
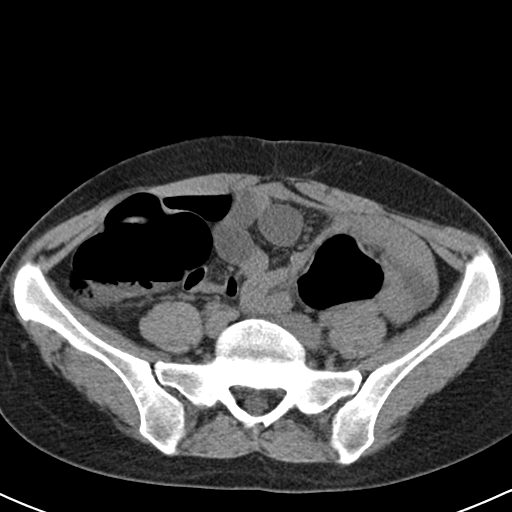
[im 44/92  soft-tissue]
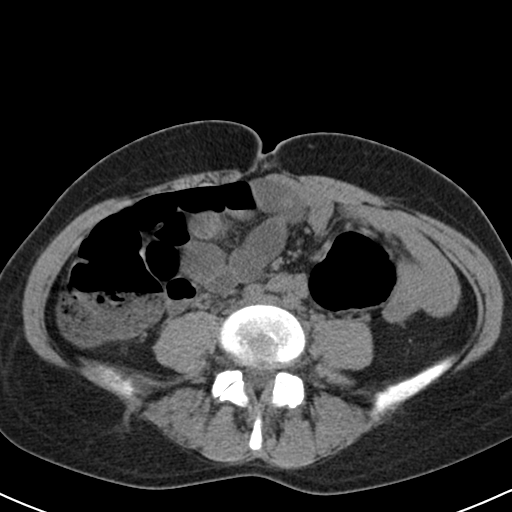
[im 48/92  soft-tissue]
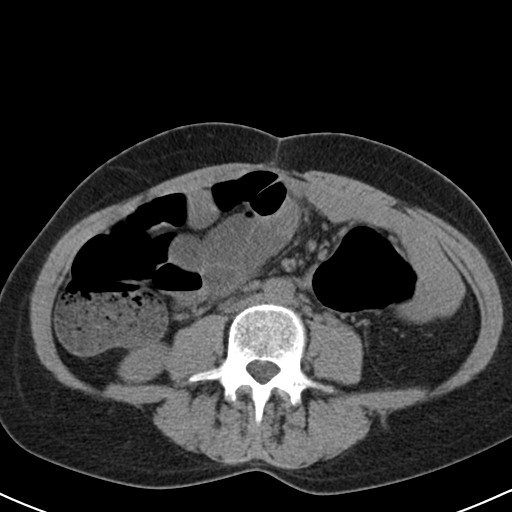
[im 55/92  soft-tissue]
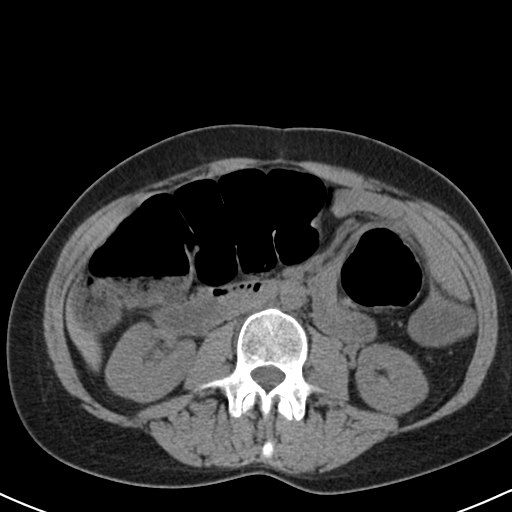
[im 55/92  bone]
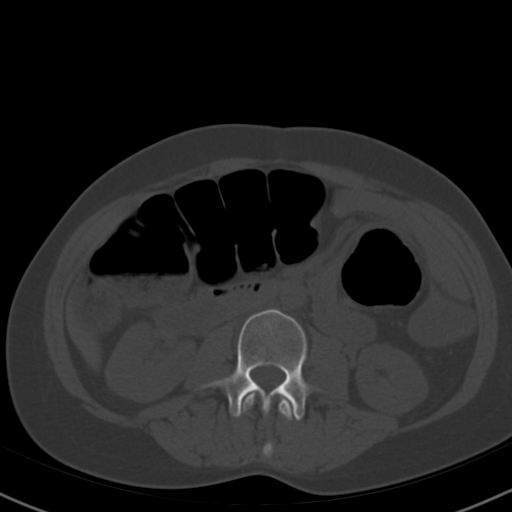
[im 62/92  soft-tissue]
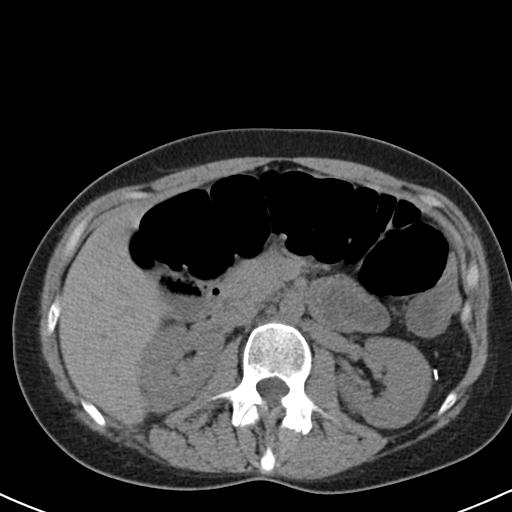
[im 70/92  soft-tissue]
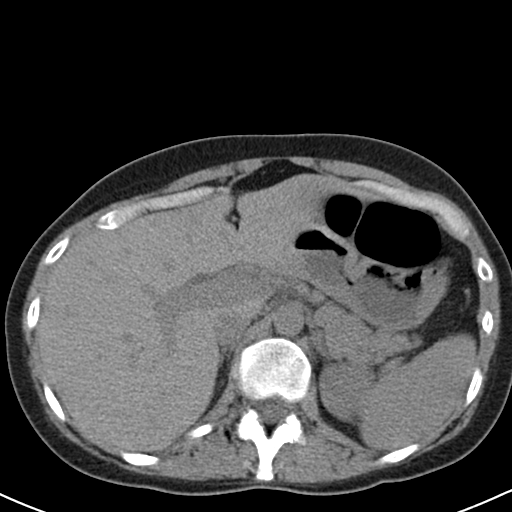
[im 73/92  soft-tissue]
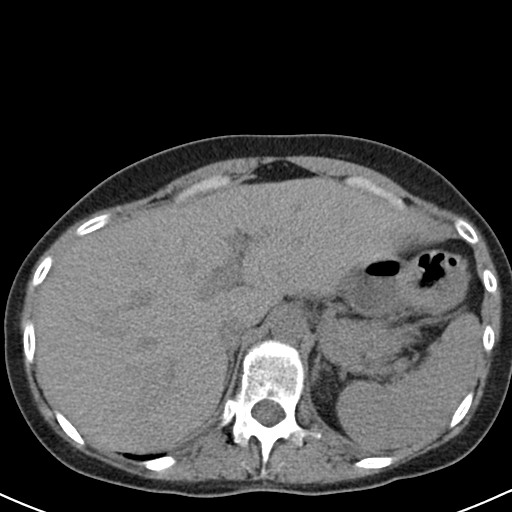
[im 81/92  soft-tissue]
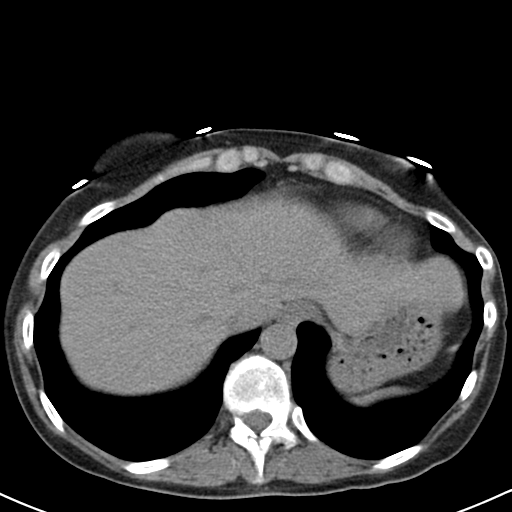
[im 88/92  soft-tissue]
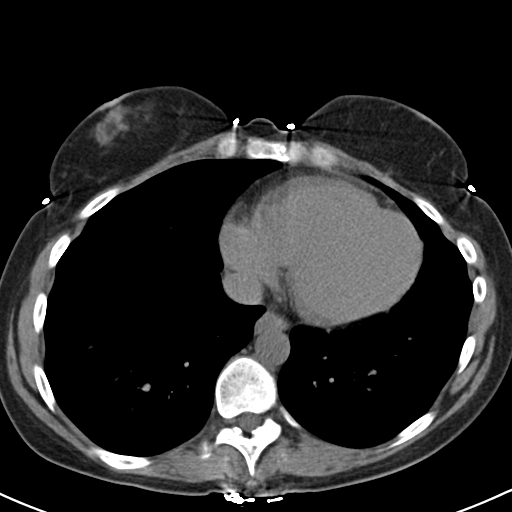

[Series 602: cor · coronal · 0.93mm/px · 3 of 96 slices shown]
[im 32/96  soft-tissue]
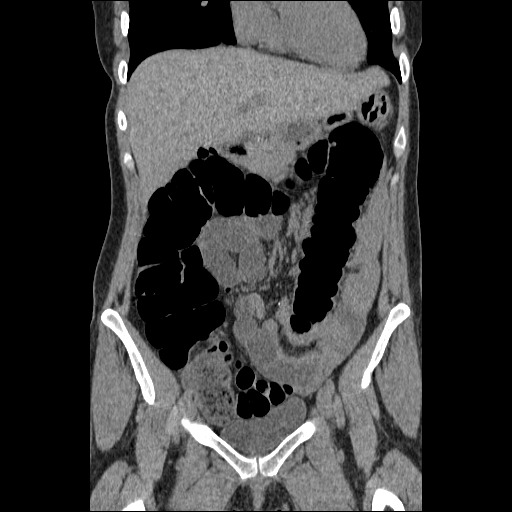
[im 43/96  soft-tissue]
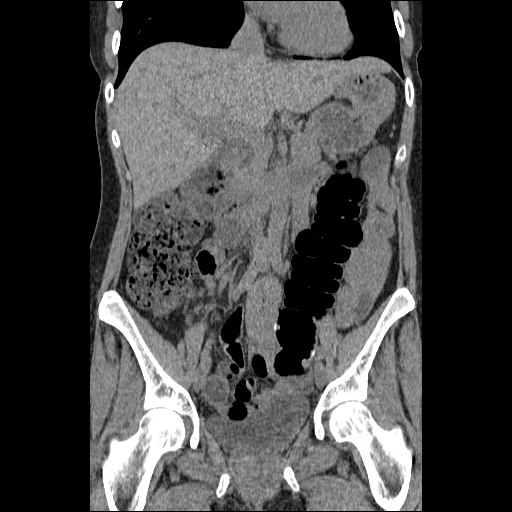
[im 53/96  soft-tissue]
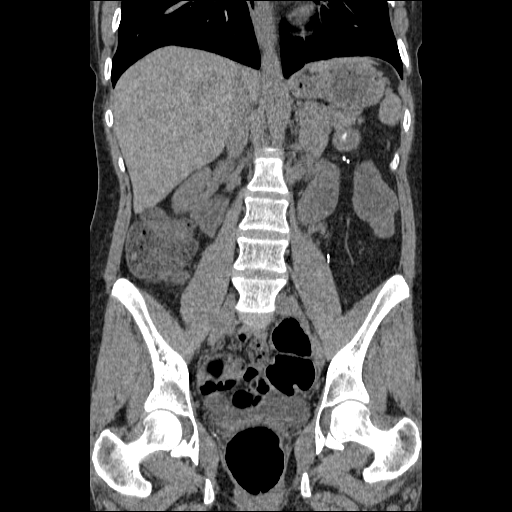

[17 of 46 positions shown; findings below may reference images not displayed]

FINDINGS: Lung bases are clear.

No pericardial or pleural effusion.

No focal liver abnormality.  Prior cholecystectomy.

Spleen is negative.

Both adrenal glands appear normal.

Normal appearance of the pancreas.

Both of the adrenal glands are normal.

Right kidney is negative.  There is a normal appearance the left
kidney.

There are no enlarged upper abdominal lymph nodes.

There is no pelvic or inguinal adenopathy.

Urinary bladder appears normal.

The stomach and the small bowel loops are within normal limits.

Proximal colon appears normal.

Postoperative changes compatible with partial colectomy with
colocolonic anastomoses is identified at the level of the sigmoid
colon.

There is no free fluid or fluid collections within the abdomen or
pelvis.
IMPRESSION: 1.  There are no findings to explain the patient's right flank pain
or hematuria.

2.  Prior left hemi colectomy.

## 2012-11-19 ENCOUNTER — Other Ambulatory Visit: Payer: Self-pay

## 2012-12-22 ENCOUNTER — Encounter: Payer: Self-pay | Admitting: Family Medicine

## 2012-12-22 ENCOUNTER — Ambulatory Visit (INDEPENDENT_AMBULATORY_CARE_PROVIDER_SITE_OTHER): Payer: Managed Care, Other (non HMO) | Admitting: Family Medicine

## 2012-12-22 VITALS — BP 130/70 | HR 55 | Temp 97.9°F | Wt 136.0 lb

## 2012-12-22 DIAGNOSIS — R141 Gas pain: Secondary | ICD-10-CM

## 2012-12-22 DIAGNOSIS — R109 Unspecified abdominal pain: Secondary | ICD-10-CM

## 2012-12-22 DIAGNOSIS — R1084 Generalized abdominal pain: Secondary | ICD-10-CM

## 2012-12-22 DIAGNOSIS — R143 Flatulence: Secondary | ICD-10-CM

## 2012-12-22 LAB — CBC WITH DIFFERENTIAL/PLATELET
Basophils Absolute: 0 10*3/uL (ref 0.0–0.1)
Eosinophils Absolute: 0.2 10*3/uL (ref 0.0–0.7)
Hemoglobin: 15.1 g/dL — ABNORMAL HIGH (ref 12.0–15.0)
Lymphocytes Relative: 38.8 % (ref 12.0–46.0)
MCHC: 34.1 g/dL (ref 30.0–36.0)
Monocytes Relative: 4.9 % (ref 3.0–12.0)
Neutro Abs: 3.1 10*3/uL (ref 1.4–7.7)
Neutrophils Relative %: 53 % (ref 43.0–77.0)
Platelets: 214 10*3/uL (ref 150.0–400.0)
RDW: 13.5 % (ref 11.5–14.6)

## 2012-12-22 NOTE — Progress Notes (Signed)
Subjective:    Patient ID: Laura Henson, female    DOB: 1975/03/30, 37 y.o.   MRN: [id number]  HPI Patient is seen for abdominal pains which are very intermittent. She has a long history of GI issues with reported IBS. Interestingly, she's not had any recent problems with diarrhea or constipation. She has frequent bloating. She has intermittent diffuse abdominal cramping across her lower abdomen bilaterally. She started avid running during the past year and has noticed some improvement in her diarrhea and constipation symptoms with running. She has noticed more than anything increased gas recently but not related to specific foods. Symptoms did not occur any particular time of day. She's tried over-the-counter things such as Beano and probiotics without improvement. She has no history of any known lactose intolerance or gluten issues. She has fairly normal bowel movements daily with no history of straining. No bloody stools. No recent appetite or weight changes. No fevers or chills.  Patient also complains of some intermittent fleeting rectal pains which she first noticed couple occasions while running. Since then she's had a couple episodes that were at rest. Duration usually about 5 minutes. Very intense sharp pain but not associated with any stool changes or abdominal pain. No pain with defecation.  Past Medical History  Diagnosis Date  . MIGRAINE HEADACHE 11/27/2009  . 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 GERD 10/09/2006  . HYPERLIPIDEMIA 10/09/2006  . PONV (postoperative nausea and vomiting)   . Rheumatic fever     13 yrs old  . Chronic kidney disease     stone hx  . Headache(784.0)     migraines hx  . Endometriosis    Past Surgical History  Procedure Laterality Date  . Appendectomy  1996  . Cesarean section    . Hemoroidectomy      scar tissue  . Abdominal hysterectomy      DUB after childbirth, endometriosis  . Oophorectomy    . Abdominal hysterectomy    . Cesarean  section    . Cholecystectomy    . Tonsillectomy    . Colon surgery      lsft hemicoloning-endometriosis    reports that she has quit smoking. Her smoking use included Cigarettes. She has a 3 pack-year smoking history. She does not have any smokeless tobacco history on file. She reports that she does not drink alcohol or use illicit drugs. family history includes Cancer (age of onset: 7) in her father; Heart disease in her father; Heart disease (age of onset: 49) in her mother; Hypertension in her father and mother. No Known Allergies    Review of Systems  Constitutional: Negative for fever and chills.  Respiratory: Negative for cough and shortness of breath.   Cardiovascular: Negative for chest pain.  Gastrointestinal: Negative for nausea, vomiting, abdominal pain, diarrhea, constipation and blood in stool.       Objective:   Physical Exam  Constitutional: She appears well-developed and well-nourished.  Neck: Neck supple. No thyromegaly present.  Cardiovascular: Normal rate and regular rhythm.   Pulmonary/Chest: Effort normal and breath sounds normal. No respiratory distress. She has no wheezes. She has no rales.  Abdominal: Soft. Bowel sounds are normal. She exhibits no distension and no mass. There is no tenderness. There is no rebound and no guarding.  Genitourinary:  Perianal exam reveals small non-thrombosed external hemorrhoid which is nontender. No anal fissures. No other abnormalities noted-no erythema or swelling          Assessment & Plan:  Patient  has nonspecific symptoms of intermittent abdominal bloating and increased flatulence. Suspect related to her IBS. She is concerned about things like celiac disease but no diarrhea, anemia or other suggestive features. Check celiac antibodies. Consider lactose-free trial but she's not had any history of lactose intolerance. She does not have any red flags such as appetite or weight changes, fever, bloody stools, etc. consider  GI referral if symptoms persist

## 2012-12-22 NOTE — Progress Notes (Signed)
Pre visit review using our clinic review tool, if applicable. No additional management support is needed unless otherwise documented below in the visit note. 

## 2012-12-23 LAB — GLIA (IGA/G) + TTG IGA
Gliadin IgA: 2.6 U/mL (ref <20)
Gliadin IgG: 7.7 U/mL (ref <20)
Tissue Transglutaminase Ab, IgA: 2.5 U/mL (ref <20)

## 2013-04-03 ENCOUNTER — Encounter: Payer: Self-pay | Admitting: Family Medicine

## 2013-04-05 ENCOUNTER — Other Ambulatory Visit: Payer: Self-pay

## 2013-04-05 MED ORDER — TRAZODONE HCL 150 MG PO TABS: 150.0000 mg | ORAL_TABLET | Freq: Every evening | ORAL | Status: DC | PRN

## 2013-06-11 ENCOUNTER — Encounter: Payer: Self-pay | Admitting: Family Medicine

## 2013-06-22 ENCOUNTER — Other Ambulatory Visit (INDEPENDENT_AMBULATORY_CARE_PROVIDER_SITE_OTHER): Payer: Managed Care, Other (non HMO)

## 2013-06-22 DIAGNOSIS — Z0000 Encounter for general adult medical examination without abnormal findings: Secondary | ICD-10-CM

## 2013-06-22 LAB — HEPATIC FUNCTION PANEL
ALT: 29 U/L (ref 0–35)
AST: 26 U/L (ref 0–37)
Albumin: 4.6 g/dL (ref 3.5–5.2)
Alkaline Phosphatase: 54 U/L (ref 39–117)
Bilirubin, Direct: 0 mg/dL (ref 0.0–0.3)
TOTAL PROTEIN: 7.1 g/dL (ref 6.0–8.3)
Total Bilirubin: 0.8 mg/dL (ref 0.2–1.2)

## 2013-06-22 LAB — POCT URINALYSIS DIPSTICK
Bilirubin, UA: NEGATIVE
Blood, UA: NEGATIVE
Glucose, UA: NEGATIVE
KETONES UA: NEGATIVE
Leukocytes, UA: NEGATIVE
Nitrite, UA: NEGATIVE
PH UA: 7
PROTEIN UA: NEGATIVE
SPEC GRAV UA: 1.01
Urobilinogen, UA: 0.2

## 2013-06-22 LAB — TSH: TSH: 0.74 u[IU]/mL (ref 0.35–4.50)

## 2013-06-22 LAB — CBC WITH DIFFERENTIAL/PLATELET
BASOS PCT: 0.6 % (ref 0.0–3.0)
Basophils Absolute: 0 10*3/uL (ref 0.0–0.1)
EOS ABS: 0.2 10*3/uL (ref 0.0–0.7)
Eosinophils Relative: 3.2 % (ref 0.0–5.0)
HCT: 42 % (ref 36.0–46.0)
HEMOGLOBIN: 14.2 g/dL (ref 12.0–15.0)
LYMPHS PCT: 45.6 % (ref 12.0–46.0)
Lymphs Abs: 2.5 10*3/uL (ref 0.7–4.0)
MCHC: 33.8 g/dL (ref 30.0–36.0)
MCV: 93.8 fl (ref 78.0–100.0)
MONO ABS: 0.3 10*3/uL (ref 0.1–1.0)
Monocytes Relative: 5.4 % (ref 3.0–12.0)
NEUTROS ABS: 2.5 10*3/uL (ref 1.4–7.7)
Neutrophils Relative %: 45.2 % (ref 43.0–77.0)
Platelets: 213 10*3/uL (ref 150.0–400.0)
RBC: 4.48 Mil/uL (ref 3.87–5.11)
RDW: 13.6 % (ref 11.5–15.5)
WBC: 5.5 10*3/uL (ref 4.0–10.5)

## 2013-06-22 LAB — LIPID PANEL
CHOLESTEROL: 207 mg/dL — AB (ref 0–200)
HDL: 62.6 mg/dL (ref >39.00)
LDL CALC: 131 mg/dL — AB (ref 0–99)
NONHDL: 144.4
Total CHOL/HDL Ratio: 3
Triglycerides: 65 mg/dL (ref 0.0–149.0)
VLDL: 13 mg/dL (ref 0.0–40.0)

## 2013-06-22 LAB — BASIC METABOLIC PANEL
BUN: 14 mg/dL (ref 6–23)
CHLORIDE: 104 meq/L (ref 96–112)
CO2: 28 mEq/L (ref 19–32)
CREATININE: 0.6 mg/dL (ref 0.4–1.2)
Calcium: 10.2 mg/dL (ref 8.4–10.5)
GFR: 120.45 mL/min (ref >60.00)
GLUCOSE: 92 mg/dL (ref 70–99)
Potassium: 4.1 mEq/L (ref 3.5–5.1)
Sodium: 141 mEq/L (ref 135–145)

## 2013-06-28 ENCOUNTER — Ambulatory Visit (INDEPENDENT_AMBULATORY_CARE_PROVIDER_SITE_OTHER): Payer: Managed Care, Other (non HMO) | Admitting: Family Medicine

## 2013-06-28 ENCOUNTER — Encounter: Payer: Self-pay | Admitting: Family Medicine

## 2013-06-28 VITALS — BP 118/72 | HR 70 | Temp 98.4°F | Ht 71.0 in | Wt 135.0 lb

## 2013-06-28 DIAGNOSIS — E785 Hyperlipidemia, unspecified: Secondary | ICD-10-CM

## 2013-06-28 DIAGNOSIS — Z0000 Encounter for general adult medical examination without abnormal findings: Secondary | ICD-10-CM

## 2013-06-28 MED ORDER — LINACLOTIDE 145 MCG PO CAPS: 145.0000 ug | ORAL_CAPSULE | Freq: Every day | ORAL | Status: DC

## 2013-06-28 MED ORDER — PRAVASTATIN SODIUM 20 MG PO TABS: 20.0000 mg | ORAL_TABLET | Freq: Every day | ORAL | Status: DC

## 2013-06-28 NOTE — Progress Notes (Signed)
Subjective:    Patient ID: Laura Henson, female    DOB: [birth year], 38 y.o.   MRN: [id number]  HPI Patient seen for complete physical. She's had previous hysterectomy several years ago secondary to endometriosis. She does not get Pap smears that reason. She has chronic insomnia treated with trazodone. She's made some very positive lifestyle changes in the past few years. She quit smoking. She is currently training for triathlon and has already run four marathons this year. She is working with a Psychologist, educationaltrainer.  She's had some lifelong chronic constipation. No history of IBS. She's tried stool softeners and senna without much improvement. She drinks plenty of fluids. Tetanus up-to-date.  Family history significant for both parents with CAD in their 30s or 6140s  Past Medical History  Diagnosis Date  . MIGRAINE HEADACHE 11/27/2009  . 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 GERD 10/09/2006  . HYPERLIPIDEMIA 10/09/2006  . PONV (postoperative nausea and vomiting)   . Rheumatic fever     38 yrs old  . Chronic kidney disease     stone hx  . Headache(784.0)     migraines hx  . Endometriosis    Past Surgical History  Procedure Laterality Date  . Appendectomy  1996  . Cesarean section    . Hemoroidectomy      scar tissue  . Abdominal hysterectomy      DUB after childbirth, endometriosis  . Oophorectomy    . Abdominal hysterectomy    . Cesarean section    . Cholecystectomy    . Tonsillectomy    . Colon surgery      lsft hemicoloning-endometriosis    reports that she has quit smoking. Her smoking use included Cigarettes. She has a 3 pack-year smoking history. She does not have any smokeless tobacco history on file. She reports that she does not drink alcohol or use illicit drugs. family history includes Cancer (age of onset: 3950) in her father; Heart disease in her father; Heart disease (age of onset: 3939) in her mother; Hypertension in her father and mother. No Known Allergies    Review of  Systems  Constitutional: Negative for fever, activity change, appetite change, fatigue and unexpected weight change.  HENT: Negative for hearing loss, sore throat and trouble swallowing.   Respiratory: Negative for cough, shortness of breath and wheezing.   Cardiovascular: Negative for chest pain, palpitations and leg swelling.  Gastrointestinal: Positive for constipation. Negative for nausea, vomiting, abdominal pain, blood in stool and abdominal distention.  Genitourinary: Negative for dysuria and hematuria.  Musculoskeletal: Negative for gait problem and myalgias.  Skin: Negative for rash.  Neurological: Negative for dizziness, syncope, weakness and headaches.  Hematological: Negative for adenopathy.  Psychiatric/Behavioral: Negative for confusion and dysphoric mood.       Objective:   Physical Exam  Constitutional: She is oriented to person, place, and time. She appears well-developed and well-nourished.  HENT:  Head: Normocephalic and atraumatic.  Eyes: EOM are normal. Pupils are equal, round, and reactive to light.  Neck: Normal range of motion. Neck supple. No thyromegaly present.  Cardiovascular: Normal rate, regular rhythm and normal heart sounds.   No murmur heard. Pulmonary/Chest: Breath sounds normal. No respiratory distress. She has no wheezes. She has no rales.  Abdominal: Soft. Bowel sounds are normal. She exhibits no distension and no mass. There is no tenderness. There is no rebound and no guarding.  Musculoskeletal: Normal range of motion. She exhibits no edema.  Lymphadenopathy:    She has no cervical  adenopathy.  Neurological: She is alert and oriented to person, place, and time. She displays normal reflexes. No cranial nerve deficit.  Skin: No rash noted.  Psychiatric: She has a normal mood and affect. Her behavior is normal. Judgment and thought content normal.          Assessment & Plan:  Complete physical. Labs reviewed. No major concerns. She has very  strong family history of premature CAD. We discussed risk and benefits of statin. Her LDL is 131. We recommend pravastatin 20 mg once daily. She had previous intolerance to Lipitor. Repeat lipids in 2 months  Regarding chronic idiopathic constipation, trial of Linzess 145 mg once daily. Reviewed possible side effects. Samples provided.

## 2013-06-28 NOTE — Progress Notes (Signed)
Pre visit review using our clinic review tool, if applicable. No additional management support is needed unless otherwise documented below in the visit note. 

## 2013-11-16 ENCOUNTER — Encounter: Payer: Self-pay | Admitting: Internal Medicine

## 2013-11-16 ENCOUNTER — Ambulatory Visit (INDEPENDENT_AMBULATORY_CARE_PROVIDER_SITE_OTHER): Payer: Managed Care, Other (non HMO) | Admitting: Internal Medicine

## 2013-11-16 VITALS — BP 120/78 | HR 61 | Temp 98.2°F | Ht 71.0 in | Wt 136.0 lb

## 2013-11-16 DIAGNOSIS — R5382 Chronic fatigue, unspecified: Secondary | ICD-10-CM

## 2013-11-16 DIAGNOSIS — E876 Hypokalemia: Secondary | ICD-10-CM

## 2013-11-16 DIAGNOSIS — E785 Hyperlipidemia, unspecified: Secondary | ICD-10-CM

## 2013-11-16 NOTE — Patient Instructions (Signed)
Call or return to clinic prn if these symptoms worsen or fail to improve as anticipated.

## 2013-11-16 NOTE — Progress Notes (Signed)
Subjective:    Patient ID: Laura Henson, female    DOB: 1975-03-29, 38 y.o.   MRN: [id number]  HPI 38 year old patient who participated in the [place] approximately 4 weeks ago.  She states that one day prior to the competition, she developed an acute febrile illness, but completed the race.  At one point in the race she developed left-sided chest pain and was forced to walk for a period of time.  She took decongestants for symptom control and also stated that she noted her heart rate to be as high as 200 during the competition. Following the race, she has generally felt unwell with fatigue and a general sense of unsteadiness.  She continues to have rare brief sharp anterior chest wall discomfort that is paroxysmal and not associated with any particular activity.  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  No fever  Past Medical History  Diagnosis Date  . MIGRAINE HEADACHE 11/27/2009  . 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 GERD 10/09/2006  . HYPERLIPIDEMIA 10/09/2006  . PONV (postoperative nausea and vomiting)   . Rheumatic fever     38 yrs old  . Chronic kidney disease     stone hx  . Headache(784.0)     migraines hx  . Endometriosis     History   Social History  . Marital Status: Married    Spouse Name: N/A    Number of Children: N/A  . Years of Education: N/A   Occupational History  . Not on file.   Social History Main Topics  . Smoking status: Former Smoker -- 0.50 packs/day for 6 years    Types: Cigarettes  . Smokeless tobacco: Not on file  . Alcohol Use: No  . Drug Use: No  . Sexual Activity: Yes     Comment: hysterectomy   Other Topics Concern  . Not on file   Social History Narrative   ** Merged History Encounter **        Past Surgical History  Procedure Laterality Date  . Appendectomy  1996  . Cesarean section    . Hemoroidectomy      scar tissue  . Abdominal hysterectomy      DUB after  childbirth, endometriosis  . Oophorectomy    . Abdominal hysterectomy    . Cesarean section    . Cholecystectomy    . Tonsillectomy    . Colon surgery      lsft hemicoloning-endometriosis    Family History  Problem Relation Age of Onset  . Heart disease Mother 6735    MI  . Hypertension Mother   . Heart disease Father   . Cancer Father 60    lung  . Hypertension Father     No Known Allergies  Current Outpatient Prescriptions on File Prior to Visit  Medication Sig Dispense Refill  . Glucosamine 500 MG CAPS Take 500 capsules by mouth 2 (two) times daily.    . Linaclotide (LINZESS) 145 MCG CAPS capsule Take 1 capsule (145 mcg total) by mouth daily. 90 capsule 3  . Multiple Vitamins-Minerals (WOMENS MULTI VITAMIN & MINERAL PO) Take by mouth daily.      . pravastatin (PRAVACHOL) 20 MG tablet Take 1 tablet (20 mg total) by mouth daily. 90 tablet 3  . traZODone (DESYREL) 150 MG tablet Take 1 tablet (150 mg total) by mouth at bedtime as needed. For sleep 60 tablet 3   No current facility-administered medications  on file prior to visit.    BP 120/78 mmHg  Pulse 61  Temp(Src) 98.2 F (36.8 C) (Oral)  Ht 5\' 11"  (1.803 m)  Wt 136 lb (61.689 kg)  BMI 18.98 kg/m2  SpO2 98%    Review of Systems  Constitutional: Positive for activity change and fatigue.  HENT: Negative for congestion, dental problem, hearing loss, rhinorrhea, sinus pressure, sore throat and tinnitus.   Eyes: Negative for pain, discharge and visual disturbance.  Respiratory: Negative for cough and shortness of breath.   Cardiovascular: Positive for chest pain. Negative for palpitations and leg swelling.  Gastrointestinal: Negative for nausea, vomiting, abdominal pain, diarrhea, constipation, blood in stool and abdominal distention.  Genitourinary: Negative for dysuria, urgency, frequency, hematuria, flank pain, vaginal bleeding, vaginal discharge, difficulty urinating, vaginal pain and pelvic pain.  Musculoskeletal:  Negative for joint swelling, arthralgias and gait problem.  Skin: Negative for rash.  Neurological: Positive for dizziness. Negative for syncope, speech difficulty, weakness, numbness and headaches.  Hematological: Negative for adenopathy.  Psychiatric/Behavioral: Negative for behavioral problems, dysphoric mood and agitation. The patient is not nervous/anxious.        Objective:   Physical Exam  Constitutional: She is oriented to person, place, and time. She appears well-developed and well-nourished. No distress.  HENT:  Head: Normocephalic.  Right Ear: External ear normal.  Left Ear: External ear normal.  Mouth/Throat: Oropharynx is clear and moist.  Eyes: Conjunctivae and EOM are normal. Pupils are equal, round, and reactive to light.  Neck: Normal range of motion. Neck supple. No thyromegaly present.  Cardiovascular: Normal rate, regular rhythm, normal heart sounds and intact distal pulses.   Pulse rate 60  Pulmonary/Chest: Effort normal and breath sounds normal.  O2 saturation 98  Abdominal: Soft. Bowel sounds are normal. She exhibits no mass. There is no tenderness.  Musculoskeletal: Normal range of motion.  Lymphadenopathy:    She has no cervical adenopathy.  Neurological: She is alert and oriented to person, place, and time.  Skin: Skin is warm and dry. No rash noted.  Psychiatric: She has a normal mood and affect. Her behavior is normal.          Assessment & Plan:   Probable post viral fatigue.  Will check screening lab.  Patient does have a history of hypokalemia and at the time of her last exam was placed on statin therapy.  We'll check LFTs.  Will attempt to moderate her activities and get more rest. Further evaluation depending on lab results and clinical response

## 2013-11-16 NOTE — Progress Notes (Signed)
Pre visit review using our clinic review tool, if applicable. No additional management support is needed unless otherwise documented below in the visit note. 

## 2013-11-17 LAB — LIPID PANEL
CHOL/HDL RATIO: 3
Cholesterol: 204 mg/dL — ABNORMAL HIGH (ref 0–200)
HDL: 63.5 mg/dL (ref >39.00)
LDL Cholesterol: 127 mg/dL — ABNORMAL HIGH (ref 0–99)
NONHDL: 140.5
Triglycerides: 67 mg/dL (ref 0.0–149.0)
VLDL: 13.4 mg/dL (ref 0.0–40.0)

## 2013-11-17 LAB — CBC WITH DIFFERENTIAL/PLATELET
BASOS ABS: 0 10*3/uL (ref 0.0–0.1)
BASOS PCT: 0.4 % (ref 0.0–3.0)
EOS ABS: 0.1 10*3/uL (ref 0.0–0.7)
Eosinophils Relative: 1.4 % (ref 0.0–5.0)
HEMATOCRIT: 45.2 % (ref 36.0–46.0)
HEMOGLOBIN: 14.8 g/dL (ref 12.0–15.0)
LYMPHS ABS: 2.9 10*3/uL (ref 0.7–4.0)
Lymphocytes Relative: 34.4 % (ref 12.0–46.0)
MCHC: 32.7 g/dL (ref 30.0–36.0)
MCV: 95.1 fl (ref 78.0–100.0)
MONO ABS: 0.3 10*3/uL (ref 0.1–1.0)
Monocytes Relative: 3.3 % (ref 3.0–12.0)
NEUTROS ABS: 5.1 10*3/uL (ref 1.4–7.7)
Neutrophils Relative %: 60.5 % (ref 43.0–77.0)
Platelets: 223 10*3/uL (ref 150.0–400.0)
RBC: 4.76 Mil/uL (ref 3.87–5.11)
RDW: 12.9 % (ref 11.5–15.5)
WBC: 8.5 10*3/uL (ref 4.0–10.5)

## 2013-11-17 LAB — COMPREHENSIVE METABOLIC PANEL
ALT: 25 U/L (ref 0–35)
AST: 25 U/L (ref 0–37)
Albumin: 4.3 g/dL (ref 3.5–5.2)
Alkaline Phosphatase: 56 U/L (ref 39–117)
BUN: 11 mg/dL (ref 6–23)
CALCIUM: 10.5 mg/dL (ref 8.4–10.5)
CHLORIDE: 105 meq/L (ref 96–112)
CO2: 27 mEq/L (ref 19–32)
CREATININE: 0.7 mg/dL (ref 0.4–1.2)
GFR: 100.33 mL/min (ref >60.00)
Glucose, Bld: 88 mg/dL (ref 70–99)
Potassium: 4.8 mEq/L (ref 3.5–5.1)
Sodium: 140 mEq/L (ref 135–145)
Total Bilirubin: 0.8 mg/dL (ref 0.2–1.2)
Total Protein: 7.6 g/dL (ref 6.0–8.3)

## 2013-11-17 LAB — SEDIMENTATION RATE: SED RATE: 10 mm/h (ref 0–22)

## 2013-11-18 LAB — TSH: TSH: 1.02 u[IU]/mL (ref 0.35–4.50)

## 2013-11-21 ENCOUNTER — Encounter: Payer: Self-pay | Admitting: Family Medicine

## 2013-11-22 ENCOUNTER — Other Ambulatory Visit: Payer: Self-pay

## 2013-11-22 MED ORDER — TRAZODONE HCL 150 MG PO TABS: 150.0000 mg | ORAL_TABLET | Freq: Every evening | ORAL | Status: DC | PRN

## 2013-11-23 ENCOUNTER — Other Ambulatory Visit: Payer: Self-pay

## 2013-11-23 MED ORDER — TRAZODONE HCL 150 MG PO TABS: 150.0000 mg | ORAL_TABLET | Freq: Every evening | ORAL | Status: DC | PRN

## 2014-03-07 ENCOUNTER — Other Ambulatory Visit: Payer: Self-pay

## 2014-03-07 ENCOUNTER — Encounter: Payer: Self-pay | Admitting: Family Medicine

## 2014-03-07 ENCOUNTER — Other Ambulatory Visit: Payer: Self-pay | Admitting: Family Medicine

## 2014-03-07 DIAGNOSIS — D649 Anemia, unspecified: Secondary | ICD-10-CM

## 2014-03-07 DIAGNOSIS — Z1231 Encounter for screening mammogram for malignant neoplasm of breast: Secondary | ICD-10-CM

## 2014-03-10 ENCOUNTER — Other Ambulatory Visit (INDEPENDENT_AMBULATORY_CARE_PROVIDER_SITE_OTHER): Payer: Managed Care, Other (non HMO)

## 2014-03-10 DIAGNOSIS — E785 Hyperlipidemia, unspecified: Secondary | ICD-10-CM

## 2014-03-10 DIAGNOSIS — D649 Anemia, unspecified: Secondary | ICD-10-CM

## 2014-03-10 LAB — CBC WITH DIFFERENTIAL/PLATELET
Basophils Absolute: 0 10*3/uL (ref 0.0–0.1)
Basophils Relative: 0.6 % (ref 0.0–3.0)
Eosinophils Absolute: 0.2 10*3/uL (ref 0.0–0.7)
Eosinophils Relative: 3.3 % (ref 0.0–5.0)
HCT: 41.1 % (ref 36.0–46.0)
Hemoglobin: 14 g/dL (ref 12.0–15.0)
Lymphocytes Relative: 39.5 % (ref 12.0–46.0)
Lymphs Abs: 1.8 10*3/uL (ref 0.7–4.0)
MCHC: 34.1 g/dL (ref 30.0–36.0)
MCV: 92.7 fl (ref 78.0–100.0)
MONOS PCT: 5.9 % (ref 3.0–12.0)
Monocytes Absolute: 0.3 10*3/uL (ref 0.1–1.0)
NEUTROS PCT: 50.7 % (ref 43.0–77.0)
Neutro Abs: 2.4 10*3/uL (ref 1.4–7.7)
PLATELETS: 187 10*3/uL (ref 150.0–400.0)
RBC: 4.44 Mil/uL (ref 3.87–5.11)
RDW: 13.5 % (ref 11.5–15.5)
WBC: 4.7 10*3/uL (ref 4.0–10.5)

## 2014-03-10 LAB — HEPATIC FUNCTION PANEL
ALK PHOS: 62 U/L (ref 39–117)
ALT: 26 U/L (ref 0–35)
AST: 21 U/L (ref 0–37)
Albumin: 4.6 g/dL (ref 3.5–5.2)
BILIRUBIN TOTAL: 0.5 mg/dL (ref 0.2–1.2)
Bilirubin, Direct: 0.1 mg/dL (ref 0.0–0.3)
TOTAL PROTEIN: 7.1 g/dL (ref 6.0–8.3)

## 2014-03-10 LAB — LIPID PANEL
Cholesterol: 173 mg/dL (ref 0–200)
HDL: 60.9 mg/dL (ref >39.00)
LDL Cholesterol: 98 mg/dL (ref 0–99)
NONHDL: 112.1
Total CHOL/HDL Ratio: 3
Triglycerides: 71 mg/dL (ref 0.0–149.0)
VLDL: 14.2 mg/dL (ref 0.0–40.0)

## 2014-03-28 ENCOUNTER — Ambulatory Visit: Payer: Managed Care, Other (non HMO)

## 2014-04-01 ENCOUNTER — Ambulatory Visit
Admission: RE | Admit: 2014-04-01 | Discharge: 2014-04-01 | Disposition: A | Discharge status: Home / Self Care | Payer: Managed Care, Other (non HMO) | Source: Ambulatory Visit

## 2014-04-01 DIAGNOSIS — Z1231 Encounter for screening mammogram for malignant neoplasm of breast: Secondary | ICD-10-CM

## 2014-06-06 ENCOUNTER — Encounter: Payer: Self-pay | Admitting: Family Medicine

## 2014-06-07 MED ORDER — NITROFURANTOIN MONOHYD MACRO 100 MG PO CAPS: 100.0000 mg | ORAL_CAPSULE | Freq: Two times a day (BID) | ORAL | Status: DC

## 2014-06-15 ENCOUNTER — Ambulatory Visit (INDEPENDENT_AMBULATORY_CARE_PROVIDER_SITE_OTHER): Payer: Managed Care, Other (non HMO) | Admitting: Family Medicine

## 2014-06-15 ENCOUNTER — Encounter: Payer: Self-pay | Admitting: Family Medicine

## 2014-06-15 VITALS — BP 128/80 | HR 83 | Temp 98.0°F | Wt 135.0 lb

## 2014-06-15 DIAGNOSIS — R300 Dysuria: Secondary | ICD-10-CM | POA: Diagnosis not present

## 2014-06-15 DIAGNOSIS — N898 Other specified noninflammatory disorders of vagina: Secondary | ICD-10-CM

## 2014-06-15 LAB — POCT URINALYSIS DIPSTICK
BILIRUBIN UA: NEGATIVE
Blood, UA: NEGATIVE
Glucose, UA: NEGATIVE
KETONES UA: NEGATIVE
Leukocytes, UA: NEGATIVE
Nitrite, UA: NEGATIVE
Protein, UA: NEGATIVE
Spec Grav, UA: 1.01
UROBILINOGEN UA: 0.2
pH, UA: 7

## 2014-06-15 MED ORDER — METRONIDAZOLE 500 MG PO TABS: 500.0000 mg | ORAL_TABLET | Freq: Three times a day (TID) | ORAL | Status: DC

## 2014-06-15 NOTE — Progress Notes (Signed)
Pre visit review using our clinic review tool, if applicable. No additional management support is needed unless otherwise documented below in the visit note. 

## 2014-06-15 NOTE — Progress Notes (Signed)
   Subjective:    Patient ID: Laura Henson, female    DOB: August 20, 1975, 39 y.o.   MRN: [id number]  HPI Patient seen with vaginal discharge. Last week she was treated empirically for UTI with Macrobid. Her urinary symptoms of burning have resolved. She is monogamous. Denies any itching. Discharge is slightly yellow tinged. Slight odor. She tried over-the-counter Monistat without improvement. No fevers or chills  Past Medical History  Diagnosis Date  . MIGRAINE HEADACHE 11/27/2009  . 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 GERD 10/09/2006  . HYPERLIPIDEMIA 10/09/2006  . PONV (postoperative nausea and vomiting)   . Rheumatic fever     39 yrs old  . Chronic kidney disease     stone hx  . Headache(784.0)     migraines hx  . Endometriosis    Past Surgical History  Procedure Laterality Date  . Appendectomy  1996  . Cesarean section    . Hemoroidectomy      scar tissue  . Abdominal hysterectomy      DUB after childbirth, endometriosis  . Oophorectomy    . Abdominal hysterectomy    . Cesarean section    . Cholecystectomy    . Tonsillectomy    . Colon surgery      lsft hemicoloning-endometriosis    reports that she has quit smoking. Her smoking use included Cigarettes. She has a 3 pack-year smoking history. She does not have any smokeless tobacco history on file. She reports that she does not drink alcohol or use illicit drugs. family history includes Cancer (age of onset: 7460) in her father; Heart disease in her father; Heart disease (age of onset: 3135) in her mother; Hypertension in her father and mother. No Known Allergies    Review of Systems  Constitutional: Negative for fever and chills.  Gastrointestinal: Negative for abdominal pain.  Genitourinary: Positive for vaginal discharge. Negative for dysuria, flank pain and vaginal pain.       Objective:   Physical Exam  Constitutional: She appears well-developed and well-nourished.  Cardiovascular: Normal rate and regular  rhythm.   Pulmonary/Chest: Effort normal and breath sounds normal. No respiratory distress. She has no wheezes. She has no rales.          Assessment & Plan:  Vaginal discharge. Question bacterial vaginosis. Urinalysis unremarkable. We discussed sending wet prep but decided to treat empirically with Flagyl 500 mg 3 times a day for 7 days. Wet prep and further assess if not resolving with this

## 2014-07-05 ENCOUNTER — Other Ambulatory Visit: Payer: Self-pay

## 2014-07-05 ENCOUNTER — Encounter: Payer: Self-pay | Admitting: Family Medicine

## 2014-07-05 MED ORDER — METRONIDAZOLE 500 MG PO TABS: 500.0000 mg | ORAL_TABLET | Freq: Three times a day (TID) | ORAL | Status: DC

## 2014-11-30 ENCOUNTER — Telehealth: Payer: Self-pay | Admitting: Nurse Practitioner

## 2014-11-30 ENCOUNTER — Encounter: Payer: Self-pay | Admitting: Family Medicine

## 2014-11-30 DIAGNOSIS — N3000 Acute cystitis without hematuria: Secondary | ICD-10-CM

## 2014-11-30 MED ORDER — SULFAMETHOXAZOLE-TRIMETHOPRIM 800-160 MG PO TABS: 1.0000 | ORAL_TABLET | Freq: Two times a day (BID) | ORAL | Status: DC

## 2014-11-30 MED ORDER — PRAVASTATIN SODIUM 20 MG PO TABS: 20.0000 mg | ORAL_TABLET | Freq: Every day | ORAL | Status: AC

## 2014-11-30 MED ORDER — LINACLOTIDE 145 MCG PO CAPS: 145.0000 ug | ORAL_CAPSULE | Freq: Every day | ORAL | Status: DC

## 2014-11-30 NOTE — Progress Notes (Signed)

## 2014-12-28 ENCOUNTER — Ambulatory Visit: Payer: Self-pay | Admitting: Family Medicine

## 2014-12-29 ENCOUNTER — Other Ambulatory Visit: Payer: Self-pay | Admitting: Family Medicine

## 2014-12-29 ENCOUNTER — Encounter: Payer: Self-pay | Admitting: Family Medicine

## 2014-12-29 MED ORDER — TRAZODONE HCL 150 MG PO TABS: 150.0000 mg | ORAL_TABLET | Freq: Every evening | ORAL | Status: DC | PRN

## 2015-01-18 ENCOUNTER — Telehealth: Payer: Managed Care, Other (non HMO) | Admitting: Family

## 2015-01-18 DIAGNOSIS — K047 Periapical abscess without sinus: Secondary | ICD-10-CM

## 2015-01-18 NOTE — Progress Notes (Signed)
Based on what you shared with me it looks like you have a serious condition that should be evaluated in a face to face office visit.  If you are having a true medical emergency please call 911.  If you need an urgent face to face visit, [facility] has four urgent care centers for your convenience.  . [facility]  [phone] Get Driving Directions Find a Provider at this Location  [address] [place], [place] [address] . 8 am to 8 pm Monday-Friday . 9 am to 7 pm Saturday-Sunday  . [facility]  [phone] Get Driving Directions Find a Provider at this Location  [address], [address] , [place] [address] . 8 am to 8 pm Monday-Friday . 9 am to 6 pm Saturday . 11 am to 6 pm Sunday   .  [facility] [place]  [phone] Get Driving Directions  [address] [place], [place] [address] . 8 am to 8 pm Monday-Friday . 9 am to 4 pm Saturday-Sunday   . [facility] (a walk in primary care provider)  [phone]  Get Driving Directions Find a Provider at this Location  [address] [place], [place] [address] . 8 am to 8:30 pm Monday-Thursday . 8 am to 6 pm Friday . 8 am to 4 pm Saturday-Sunday   Your e-visit answers were reviewed by a board certified advanced clinical practitioner to complete your personal care plan.  Thank you for using e-Visits. 

## 2015-01-19 ENCOUNTER — Encounter: Payer: Self-pay | Admitting: Family Medicine

## 2015-01-19 MED ORDER — PENICILLIN V POTASSIUM 500 MG PO TABS
500.0000 mg | ORAL_TABLET | Freq: Three times a day (TID) | ORAL | Status: DC
Start: 2015-01-19 — End: 2015-01-20

## 2015-01-19 NOTE — Telephone Encounter (Signed)
Please review. Should we try to get pt in here as a work in?

## 2015-01-20 ENCOUNTER — Other Ambulatory Visit: Payer: Self-pay | Admitting: Family Medicine

## 2015-01-20 MED ORDER — PENICILLIN V POTASSIUM 500 MG PO TABS: 500.0000 mg | ORAL_TABLET | Freq: Three times a day (TID) | ORAL | Status: DC

## 2015-03-26 ENCOUNTER — Encounter: Payer: Self-pay | Admitting: Family Medicine

## 2015-03-27 MED ORDER — NITROFURANTOIN MONOHYD MACRO 100 MG PO CAPS: 100.0000 mg | ORAL_CAPSULE | Freq: Two times a day (BID) | ORAL | Status: DC

## 2015-03-27 NOTE — Telephone Encounter (Signed)
Medication sent to pharmacy  

## 2015-03-28 ENCOUNTER — Other Ambulatory Visit: Payer: Self-pay | Admitting: Family Medicine

## 2015-03-28 MED ORDER — NITROFURANTOIN MONOHYD MACRO 100 MG PO CAPS: 100.0000 mg | ORAL_CAPSULE | Freq: Two times a day (BID) | ORAL | Status: DC

## 2015-04-17 ENCOUNTER — Other Ambulatory Visit: Payer: Self-pay | Admitting: *Deleted

## 2015-04-17 NOTE — Telephone Encounter (Signed)
Refill OK for #90.

## 2015-04-18 MED ORDER — TRAZODONE HCL 150 MG PO TABS: 150.0000 mg | ORAL_TABLET | Freq: Every evening | ORAL | Status: DC | PRN

## 2015-05-08 ENCOUNTER — Other Ambulatory Visit: Payer: Self-pay

## 2015-05-08 DIAGNOSIS — Z1231 Encounter for screening mammogram for malignant neoplasm of breast: Secondary | ICD-10-CM

## 2015-05-18 ENCOUNTER — Other Ambulatory Visit (INDEPENDENT_AMBULATORY_CARE_PROVIDER_SITE_OTHER): Payer: 59

## 2015-05-18 DIAGNOSIS — Z0000 Encounter for general adult medical examination without abnormal findings: Secondary | ICD-10-CM

## 2015-05-18 LAB — HEPATIC FUNCTION PANEL
ALBUMIN: 4.4 g/dL (ref 3.5–5.2)
ALT: 26 U/L (ref 0–35)
AST: 23 U/L (ref 0–37)
Alkaline Phosphatase: 58 U/L (ref 39–117)
Bilirubin, Direct: 0.1 mg/dL (ref 0.0–0.3)
TOTAL PROTEIN: 6.4 g/dL (ref 6.0–8.3)
Total Bilirubin: 0.6 mg/dL (ref 0.2–1.2)

## 2015-05-18 LAB — LIPID PANEL
CHOL/HDL RATIO: 3
Cholesterol: 191 mg/dL (ref 0–200)
HDL: 59.6 mg/dL (ref >39.00)
LDL CALC: 117 mg/dL — AB (ref 0–99)
NONHDL: 131.02
Triglycerides: 69 mg/dL (ref 0.0–149.0)
VLDL: 13.8 mg/dL (ref 0.0–40.0)

## 2015-05-18 LAB — BASIC METABOLIC PANEL
BUN: 11 mg/dL (ref 6–23)
CALCIUM: 9.8 mg/dL (ref 8.4–10.5)
CO2: 30 meq/L (ref 19–32)
CREATININE: 0.72 mg/dL (ref 0.40–1.20)
Chloride: 102 mEq/L (ref 96–112)
GFR: 94.81 mL/min (ref >60.00)
GLUCOSE: 91 mg/dL (ref 70–99)
Potassium: 3.6 mEq/L (ref 3.5–5.1)
Sodium: 139 mEq/L (ref 135–145)

## 2015-05-18 LAB — CBC WITH DIFFERENTIAL/PLATELET
BASOS ABS: 0 10*3/uL (ref 0.0–0.1)
Basophils Relative: 0.6 % (ref 0.0–3.0)
EOS ABS: 0.1 10*3/uL (ref 0.0–0.7)
Eosinophils Relative: 2.2 % (ref 0.0–5.0)
HEMATOCRIT: 39.7 % (ref 36.0–46.0)
Hemoglobin: 13.3 g/dL (ref 12.0–15.0)
LYMPHS ABS: 2.3 10*3/uL (ref 0.7–4.0)
Lymphocytes Relative: 35.1 % (ref 12.0–46.0)
MCHC: 33.6 g/dL (ref 30.0–36.0)
MCV: 94.7 fl (ref 78.0–100.0)
MONO ABS: 0.3 10*3/uL (ref 0.1–1.0)
Monocytes Relative: 5.1 % (ref 3.0–12.0)
NEUTROS ABS: 3.8 10*3/uL (ref 1.4–7.7)
NEUTROS PCT: 57 % (ref 43.0–77.0)
PLATELETS: 190 10*3/uL (ref 150.0–400.0)
RBC: 4.19 Mil/uL (ref 3.87–5.11)
RDW: 13.8 % (ref 11.5–15.5)
WBC: 6.7 10*3/uL (ref 4.0–10.5)

## 2015-05-18 LAB — TSH: TSH: 0.96 u[IU]/mL (ref 0.35–4.50)

## 2015-05-23 ENCOUNTER — Encounter: Payer: Self-pay | Admitting: Family Medicine

## 2015-05-23 ENCOUNTER — Ambulatory Visit
Admission: RE | Admit: 2015-05-23 | Discharge: 2015-05-23 | Disposition: A | Discharge status: Home / Self Care | Payer: 59 | Source: Ambulatory Visit

## 2015-05-23 ENCOUNTER — Ambulatory Visit (INDEPENDENT_AMBULATORY_CARE_PROVIDER_SITE_OTHER): Payer: 59 | Admitting: Family Medicine

## 2015-05-23 VITALS — BP 90/60 | HR 64 | Temp 98.5°F | Ht 71.0 in | Wt 138.0 lb

## 2015-05-23 DIAGNOSIS — Z1231 Encounter for screening mammogram for malignant neoplasm of breast: Secondary | ICD-10-CM

## 2015-05-23 DIAGNOSIS — Z0000 Encounter for general adult medical examination without abnormal findings: Secondary | ICD-10-CM

## 2015-05-23 MED ORDER — LUBIPROSTONE 8 MCG PO CAPS: 8.0000 ug | ORAL_CAPSULE | Freq: Two times a day (BID) | ORAL | Status: DC

## 2015-05-23 NOTE — Progress Notes (Signed)
Subjective:    Patient ID: Laura Henson, female    DOB: August 06, 1975, [age] y.o.   MRN: [id number]  HPI  Patient seen for physical exam.  She's had previous hysterectomy for endometriosis several years ago.  She had mammogram earlier today.  She runs about 20 miles per week. She has run several marathons in the past couple of years.   She has hyperlipidemia and strong family history of CAD.  She states her father had CAD in his 3030s and mother had CAD issues apparently in her late 6930s to early 4540s and just died about 6 weeks ago of complications of MI at age 41.  Patient has mild dyslipidemia on pravastatin. She quit smoking a few years ago. No history of hypertension or diabetes. No recent chest pains or dyspnea with exercise.   Tetanus is up-to-date.   She has long history of IBS-related constipation. She has constipation predominant IBS. She is currently taking Linzess  But does not feel this is working very well. She still has some constipation issues. She has lots of fiber and tries to drink plenty of fluids. She has never noted any blood in her stools.  She has long history of  Insomnia and uses trazodone  Past Medical History  Diagnosis Date  . MIGRAINE HEADACHE 11/27/2009  . 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 GERD 10/09/2006  . HYPERLIPIDEMIA 10/09/2006  . PONV (postoperative nausea and vomiting)   . Rheumatic fever     40 yrs old  . Chronic kidney disease     stone hx  . Headache(784.0)     migraines hx  . Endometriosis    Past Surgical History  Procedure Laterality Date  . Appendectomy  1996  . Cesarean section    . Hemoroidectomy      scar tissue  . Abdominal hysterectomy      DUB after childbirth, endometriosis  . Oophorectomy    . Abdominal hysterectomy    . Cesarean section    . Cholecystectomy    . Tonsillectomy    . Colon surgery      lsft hemicoloning-endometriosis    reports that she has quit smoking. Her smoking use included Cigarettes. She has a  3 pack-year smoking history. She does not have any smokeless tobacco history on file. She reports that she does not drink alcohol or use illicit drugs. family history includes Cancer (age of onset: 1760) in her father; Heart disease in her father; Heart disease (age of onset: 4835) in her mother; Hypertension in her father and mother. No Known Allergies    Review of Systems  Constitutional: Negative for fever, activity change, appetite change, fatigue and unexpected weight change.  HENT: Negative for ear pain, hearing loss, sore throat and trouble swallowing.   Eyes: Negative for visual disturbance.  Respiratory: Negative for cough and shortness of breath.   Cardiovascular: Negative for chest pain and palpitations.  Gastrointestinal: Positive for constipation and abdominal distention. Negative for abdominal pain, diarrhea and blood in stool.  Genitourinary: Negative for dysuria and hematuria.  Musculoskeletal: Negative for myalgias, back pain and arthralgias.  Skin: Negative for rash.  Neurological: Negative for dizziness, syncope and headaches.  Hematological: Negative for adenopathy.  Psychiatric/Behavioral: Negative for confusion and dysphoric mood.       Objective:   Physical Exam  Constitutional: She is oriented to person, place, and time. She appears well-developed and well-nourished.  HENT:  Head: Normocephalic and atraumatic.  Eyes: EOM are normal. Pupils are equal, round, and  reactive to light.  Neck: Normal range of motion. Neck supple. No thyromegaly present.  Cardiovascular: Normal rate, regular rhythm and normal heart sounds.   No murmur heard. Pulmonary/Chest: Breath sounds normal. No respiratory distress. She has no wheezes. She has no rales.  Abdominal: Soft. Bowel sounds are normal. She exhibits no distension and no mass. There is no tenderness. There is no rebound and no guarding.  Genitourinary:  Rectal exam no visible external hemorrhoids. Digital exam no mass. No  impaction  Musculoskeletal: Normal range of motion. She exhibits no edema.  Lymphadenopathy:    She has no cervical adenopathy.  Neurological: She is alert and oriented to person, place, and time. She displays normal reflexes. No cranial nerve deficit.  Skin: No rash noted.  Psychiatric: She has a normal mood and affect. Her behavior is normal. Judgment and thought content normal.          Assessment & Plan:  Physical exam. Labs reviewed.   Tetanus is up-to-date. No indication for Pap smear with prior hysterectomy. Amitiza for constipation predominant IBS- will start 8 g twice a day.  Consider titration to 24 g if not seeing results in the next few weeks. Continue high-fiber diet and plenty of fluids  Given her very strong family history of  Premature CAD, we discussed consideration for coronary calcium score but at this point she is not interested  Kristian Covey MD [facility]

## 2015-05-23 NOTE — Progress Notes (Signed)
Pre visit review using our clinic review tool, if applicable. No additional management support is needed unless otherwise documented below in the visit note. 

## 2015-05-27 ENCOUNTER — Encounter: Payer: Self-pay | Admitting: Family Medicine

## 2015-05-29 ENCOUNTER — Other Ambulatory Visit: Payer: Self-pay

## 2015-05-29 MED ORDER — LUBIPROSTONE 8 MCG PO CAPS: 8.0000 ug | ORAL_CAPSULE | Freq: Two times a day (BID) | ORAL | Status: DC

## 2015-07-11 ENCOUNTER — Encounter: Payer: Self-pay | Admitting: Family Medicine

## 2015-07-11 ENCOUNTER — Ambulatory Visit (INDEPENDENT_AMBULATORY_CARE_PROVIDER_SITE_OTHER): Payer: 59 | Admitting: Family Medicine

## 2015-07-11 VITALS — BP 108/80 | HR 65 | Temp 98.1°F | Ht 71.0 in | Wt 135.0 lb

## 2015-07-11 DIAGNOSIS — R5383 Other fatigue: Secondary | ICD-10-CM

## 2015-07-11 DIAGNOSIS — M2550 Pain in unspecified joint: Secondary | ICD-10-CM | POA: Diagnosis not present

## 2015-07-11 LAB — CBC WITH DIFFERENTIAL/PLATELET
BASOS PCT: 1 % (ref 0.0–3.0)
Basophils Absolute: 0 10*3/uL (ref 0.0–0.1)
EOS ABS: 0.2 10*3/uL (ref 0.0–0.7)
EOS PCT: 4.3 % (ref 0.0–5.0)
HEMATOCRIT: 42.4 % (ref 36.0–46.0)
Hemoglobin: 14.3 g/dL (ref 12.0–15.0)
LYMPHS PCT: 37 % (ref 12.0–46.0)
Lymphs Abs: 1.9 10*3/uL (ref 0.7–4.0)
MCHC: 33.8 g/dL (ref 30.0–36.0)
MCV: 93.5 fl (ref 78.0–100.0)
MONO ABS: 0.3 10*3/uL (ref 0.1–1.0)
Monocytes Relative: 5.3 % (ref 3.0–12.0)
NEUTROS ABS: 2.7 10*3/uL (ref 1.4–7.7)
Neutrophils Relative %: 52.4 % (ref 43.0–77.0)
PLATELETS: 171 10*3/uL (ref 150.0–400.0)
RBC: 4.54 Mil/uL (ref 3.87–5.11)
RDW: 13.2 % (ref 11.5–15.5)
WBC: 5.1 10*3/uL (ref 4.0–10.5)

## 2015-07-11 LAB — VITAMIN B12: Vitamin B-12: 365 pg/mL (ref 211–911)

## 2015-07-11 LAB — C-REACTIVE PROTEIN

## 2015-07-11 LAB — SEDIMENTATION RATE: Sed Rate: 1 mm/hr (ref 0–20)

## 2015-07-11 MED ORDER — PREDNISONE 10 MG PO TABS: ORAL_TABLET | ORAL | Status: DC

## 2015-07-11 NOTE — Progress Notes (Signed)
Pre visit review using our clinic review tool, if applicable. No additional management support is needed unless otherwise documented below in the visit note. 

## 2015-07-11 NOTE — Progress Notes (Signed)
Subjective:    Patient ID: Laura Henson, female    DOB: [birth year], 40 y.o.   MRN: [id number]  HPI Patient seen for acute visit with 2-3 weeks of progressive polyarthralgias She's had pains mostly involving the wrists, elbows, and hands. Fairly symmetric distribution. She's had pain mostly PIP and MCP joints. She's noted some visible swelling but no erythema or warmth. Is concerned because mother had rheumatoid arthritis. She's also had some associated nonspecific fatigue. Generalized weakness. No recent tick bite. No skin rash. No fevers or chills. No headaches. She's taken ibuprofen 800 mg twice daily without much relief. Symptoms persist  through the day.  She has nonspecific fatigue issues. She feels she is sleeping fairly well. She is a strict vegetarian. No recent B12 level. Denies any neuropathy symptoms. No history of recent anemia. She states she had "rheumatic fever "in childhood but has not had any persistent murmur. She still exercising some. No dyspnea with exertion.  Past Medical History  Diagnosis Date  . MIGRAINE HEADACHE 11/27/2009  . 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 GERD 10/09/2006  . HYPERLIPIDEMIA 10/09/2006  . PONV (postoperative nausea and vomiting)   . Rheumatic fever     40 yrs old  . Chronic kidney disease     stone hx  . Headache(784.0)     migraines hx  . Endometriosis    Past Surgical History  Procedure Laterality Date  . Appendectomy  1996  . Cesarean section    . Hemoroidectomy      scar tissue  . Abdominal hysterectomy      DUB after childbirth, endometriosis  . Oophorectomy    . Abdominal hysterectomy    . Cesarean section    . Cholecystectomy    . Tonsillectomy    . Colon surgery      lsft hemicoloning-endometriosis    reports that she has quit smoking. Her smoking use included Cigarettes. She has a 3 pack-year smoking history. She does not have any smokeless tobacco history on file. She reports that she does not drink alcohol  or use illicit drugs. family history includes Cancer (age of onset: 760) in her father; Heart disease in her father; Heart disease (age of onset: 4435) in her mother; Hypertension in her father and mother. No Known Allergies    Review of Systems  Constitutional: Positive for fatigue. Negative for fever and chills.  Respiratory: Negative for cough and shortness of breath.   Cardiovascular: Negative for chest pain and palpitations.  Gastrointestinal: Negative for abdominal distention.  Genitourinary: Negative for dysuria.  Musculoskeletal: Positive for arthralgias.  Skin: Negative for rash.  Neurological: Positive for weakness. Negative for dizziness, numbness and headaches.       Objective:   Physical Exam  Constitutional: She appears well-developed and well-nourished.  HENT:  Mouth/Throat: Oropharynx is clear and moist.  Neck: Neck supple. No thyromegaly present.  Cardiovascular: Normal rate and regular rhythm.  Exam reveals no gallop.   No murmur heard. Pulmonary/Chest: Effort normal and breath sounds normal. No respiratory distress. She has no wheezes. She has no rales.  Musculoskeletal: She exhibits no edema.  No visible joint edema, erythema, or warmth.  Lymphadenopathy:    She has no cervical adenopathy.          Assessment & Plan:  Symmetric polyarthralgias mostly involving upper extremities. No objective evidence for inflammation at this time. Check further labs with CBC, sedimentation rate, C-reactive protein, ANA, CCP antibody, and rheumatoid factor. Brief prednisone taper over the next week.  Eulas Post MD [facility]

## 2015-07-12 ENCOUNTER — Encounter: Payer: Self-pay | Admitting: Family Medicine

## 2015-07-12 LAB — CYCLIC CITRUL PEPTIDE ANTIBODY, IGG

## 2015-07-12 LAB — RHEUMATOID FACTOR

## 2015-07-12 LAB — ANA: Anti Nuclear Antibody(ANA): NEGATIVE

## 2015-08-22 ENCOUNTER — Encounter: Payer: Self-pay | Admitting: Family Medicine

## 2015-08-22 MED ORDER — TRAZODONE HCL 150 MG PO TABS: 150.0000 mg | ORAL_TABLET | Freq: Every evening | ORAL | 1 refills | Status: DC | PRN

## 2015-09-04 ENCOUNTER — Other Ambulatory Visit: Payer: Self-pay | Admitting: Family Medicine

## 2015-09-04 NOTE — Telephone Encounter (Signed)
Last OV 07/11/15  Last refill 08/04/2015 #90 Please advise

## 2015-09-04 NOTE — Telephone Encounter (Signed)
Refill for 6 months. 

## 2015-09-05 ENCOUNTER — Encounter: Payer: Self-pay | Admitting: Family Medicine

## 2015-09-06 ENCOUNTER — Other Ambulatory Visit: Payer: Self-pay

## 2015-09-06 MED ORDER — LINACLOTIDE 145 MCG PO CAPS: 145.0000 ug | ORAL_CAPSULE | Freq: Every day | ORAL | 1 refills | Status: DC

## 2015-12-19 ENCOUNTER — Other Ambulatory Visit: Payer: Self-pay

## 2015-12-19 ENCOUNTER — Encounter: Payer: Self-pay | Admitting: Family Medicine

## 2015-12-19 MED ORDER — TRAZODONE HCL 150 MG PO TABS: 150.0000 mg | ORAL_TABLET | Freq: Every evening | ORAL | 1 refills | Status: DC | PRN

## 2016-01-02 ENCOUNTER — Encounter: Payer: Self-pay | Admitting: Physician Assistant

## 2016-01-02 ENCOUNTER — Other Ambulatory Visit: Payer: Self-pay

## 2016-01-02 ENCOUNTER — Encounter: Payer: Self-pay | Admitting: Family Medicine

## 2016-01-02 MED ORDER — LINACLOTIDE 145 MCG PO CAPS: 145.0000 ug | ORAL_CAPSULE | Freq: Every day | ORAL | 1 refills | Status: DC

## 2016-01-19 ENCOUNTER — Ambulatory Visit: Payer: 59 | Admitting: Physician Assistant

## 2016-01-29 ENCOUNTER — Encounter: Payer: Self-pay | Admitting: *Deleted

## 2016-02-01 ENCOUNTER — Telehealth: Payer: 59 | Admitting: Physician Assistant

## 2016-02-01 ENCOUNTER — Ambulatory Visit: Payer: 59 | Admitting: Physician Assistant

## 2016-02-01 DIAGNOSIS — K529 Noninfective gastroenteritis and colitis, unspecified: Secondary | ICD-10-CM

## 2016-02-01 MED ORDER — ONDANSETRON HCL 4 MG PO TABS: 4.0000 mg | ORAL_TABLET | Freq: Three times a day (TID) | ORAL | 0 refills | Status: DC | PRN

## 2016-02-01 NOTE — Progress Notes (Signed)
We are sorry that you are not feeling well.  Here is how we plan to help!  Based on what you have shared with me it looks like you have Acute Infectious Diarrhea.  Most cases of acute diarrhea are due to infections with virus and bacteria and are self-limited conditions lasting less than 14 days.  For your symptoms you may take Imodium 2 mg tablets that are over the counter at your local pharmacy. Take two tablet now and then one after each loose stool up to 6 a day.  Antibiotics are not needed for most people with diarrhea.  Optional: Zofran 4 mg 1 tablet every 8 hours as needed for nausea and vomiting    HOME CARE  We recommend changing your diet to help with your symptoms for the next few days.  Drink plenty of fluids that contain water salt and sugar. Sports drinks such as Gatorade may help.   You may try broths, soups, bananas, applesauce, soft breads, mashed potatoes or crackers.   You are considered infectious for as long as the diarrhea continues. Hand washing or use of alcohol based hand sanitizers is recommend.  It is best to stay out of work or school until your symptoms stop.   GET HELP RIGHT AWAY  If you have dark yellow colored urine or do not pass urine frequently you should drink more fluids.    If your symptoms worsen   If you feel like you are going to pass out (faint)  You have a new problem  MAKE SURE YOU   Understand these instructions.  Will watch your condition.  Will get help right away if you are not doing well or get worse.  Your e-visit answers were reviewed by a board certified advanced clinical practitioner to complete your personal care plan.  Depending on the condition, your plan could have included both over the counter or prescription medications.  If there is a problem please reply  once you have received a response from your provider.  Your safety is important to us.  If you have drug allergies check your prescription carefully.     You can use MyChart to ask questions about today's visit, request a non-urgent call back, or ask for a work or school excuse for 24 hours related to this e-Visit. If it has been greater than 24 hours you will need to follow up with your provider, or enter a new e-Visit to address those concerns.   You will get an e-mail in the next two days asking about your experience.  I hope that your e-visit has been valuable and will speed your recovery. Thank you for using e-visits.   

## 2016-02-08 ENCOUNTER — Encounter: Payer: Self-pay | Admitting: *Deleted

## 2016-02-09 ENCOUNTER — Ambulatory Visit (INDEPENDENT_AMBULATORY_CARE_PROVIDER_SITE_OTHER): Payer: 59 | Admitting: Physician Assistant

## 2016-02-09 ENCOUNTER — Other Ambulatory Visit (INDEPENDENT_AMBULATORY_CARE_PROVIDER_SITE_OTHER): Payer: 59

## 2016-02-09 ENCOUNTER — Encounter: Payer: Self-pay | Admitting: Physician Assistant

## 2016-02-09 VITALS — BP 130/72 | HR 80 | Ht 71.0 in

## 2016-02-09 DIAGNOSIS — R109 Unspecified abdominal pain: Secondary | ICD-10-CM

## 2016-02-09 DIAGNOSIS — R197 Diarrhea, unspecified: Secondary | ICD-10-CM

## 2016-02-09 DIAGNOSIS — K625 Hemorrhage of anus and rectum: Secondary | ICD-10-CM

## 2016-02-09 DIAGNOSIS — K6289 Other specified diseases of anus and rectum: Secondary | ICD-10-CM | POA: Diagnosis not present

## 2016-02-09 LAB — CBC WITH DIFFERENTIAL/PLATELET
BASOS ABS: 0 10*3/uL (ref 0.0–0.1)
Basophils Relative: 0.4 % (ref 0.0–3.0)
EOS PCT: 1.3 % (ref 0.0–5.0)
Eosinophils Absolute: 0.1 10*3/uL (ref 0.0–0.7)
HCT: 42.8 % (ref 36.0–46.0)
HEMOGLOBIN: 15.1 g/dL — AB (ref 12.0–15.0)
Lymphocytes Relative: 29.3 % (ref 12.0–46.0)
Lymphs Abs: 2.1 10*3/uL (ref 0.7–4.0)
MCHC: 35.2 g/dL (ref 30.0–36.0)
MCV: 90.8 fl (ref 78.0–100.0)
MONOS PCT: 4.2 % (ref 3.0–12.0)
Monocytes Absolute: 0.3 10*3/uL (ref 0.1–1.0)
NEUTROS PCT: 64.8 % (ref 43.0–77.0)
Neutro Abs: 4.6 10*3/uL (ref 1.4–7.7)
Platelets: 237 10*3/uL (ref 150.0–400.0)
RBC: 4.71 Mil/uL (ref 3.87–5.11)
RDW: 12.7 % (ref 11.5–15.5)
WBC: 7.1 10*3/uL (ref 4.0–10.5)

## 2016-02-09 LAB — COMPREHENSIVE METABOLIC PANEL
ALK PHOS: 71 U/L (ref 39–117)
ALT: 14 U/L (ref 0–35)
AST: 17 U/L (ref 0–37)
Albumin: 4.8 g/dL (ref 3.5–5.2)
BUN: 14 mg/dL (ref 6–23)
CO2: 32 mEq/L (ref 19–32)
Calcium: 10.1 mg/dL (ref 8.4–10.5)
Chloride: 103 mEq/L (ref 96–112)
Creatinine, Ser: 0.73 mg/dL (ref 0.40–1.20)
GFR: 92.98 mL/min (ref >60.00)
GLUCOSE: 94 mg/dL (ref 70–99)
POTASSIUM: 3.9 meq/L (ref 3.5–5.1)
SODIUM: 140 meq/L (ref 135–145)
TOTAL PROTEIN: 7.3 g/dL (ref 6.0–8.3)
Total Bilirubin: 0.6 mg/dL (ref 0.2–1.2)

## 2016-02-09 LAB — C-REACTIVE PROTEIN: CRP: <0.1 mg/dL — ABNORMAL LOW (ref 0.5–20.0)

## 2016-02-09 LAB — SEDIMENTATION RATE: SED RATE: 4 mm/h (ref 0–20)

## 2016-02-09 MED ORDER — ONDANSETRON HCL 4 MG PO TABS: 4.0000 mg | ORAL_TABLET | Freq: Four times a day (QID) | ORAL | 0 refills | Status: DC | PRN

## 2016-02-09 MED ORDER — DICYCLOMINE HCL 10 MG PO CAPS: 10.0000 mg | ORAL_CAPSULE | Freq: Three times a day (TID) | ORAL | 2 refills | Status: DC | PRN

## 2016-02-09 MED ORDER — NA SULFATE-K SULFATE-MG SULF 17.5-3.13-1.6 GM/177ML PO SOLN: ORAL | 0 refills | Status: DC

## 2016-02-09 NOTE — Progress Notes (Addendum)
Subjective:    Patient ID: Laura GaudierJill D Henson, female    DOB: 04-09-1975, 41 y.o.   MRN: [id number]  HPI Laura LarssonJill is a 41 year old white female, new to GI today, self-referred for evaluation of abdominal pain and loose stools and rectal bleeding. Patient states that she [place] ago and had an upper endoscopy at that time. She was told that she had an ulcer. She does not recall the name of the practice or the physician. She has history of endometriosis and underwent a remote hysterectomy and also left hemicolectomy in 1996 secondary to endometriosis. She is also status post cholecystectomy, appendectomy, has history of migraines and previous history of anorexia and bulimia. She has been taking Lynn's S over the past couple of years for chronic constipation and says that his work well though occasionally does get diarrhea. Patient has been having problems with abdominal pain over the past year but ongoing symptoms since December 2017. She complains primarily of left-sided abdominal pain and cramping and has now developed rectal pain and pressure as well which is been intermittent. She's generally having 3-4 loose bowel movements per day and noticing intermittent bright red blood in her stools. About 3 days ago she had an acute episode of abdominal pain bloating on the some diaphoresis and then had several episodes of diarrhea with large amounts of blood. Says this lasted less than 24 hours. She has also been having recent nausea. She believes her weight is been stable. Fine Patient says she tries to eat very healthy, has been using probiotics and is generally following a vegan diet She says she did have a relapse with anorexia type symptoms a few years back related to divorce.  Review of Systems Pertinent positive and negative review of systems were noted in the above HPI section.  All other review of systems was otherwise negative.  Outpatient Encounter  Prescriptions as of 02/09/2016  Medication Sig  . linaclotide (LINZESS) 145 MCG CAPS capsule Take 1 capsule (145 mcg total) by mouth daily.  . Multiple Vitamins-Minerals (WOMENS MULTI VITAMIN & MINERAL PO) Take by mouth daily.    . ondansetron (ZOFRAN) 4 MG tablet Take 1 tablet (4 mg total) by mouth every 6 (six) hours as needed for nausea or vomiting.  . pravastatin (PRAVACHOL) 20 MG tablet Take 1 tablet (20 mg total) by mouth daily.  . traZODone (DESYREL) 150 MG tablet Take 1 tablet (150 mg total) by mouth at bedtime as needed. For sleep  . [DISCONTINUED] ondansetron (ZOFRAN) 4 MG tablet Take 1 tablet (4 mg total) by mouth every 8 (eight) hours as needed for nausea or vomiting. (Patient taking differently: Take 4 mg by mouth as needed for nausea or vomiting. )  . dicyclomine (BENTYL) 10 MG capsule Take 1 capsule (10 mg total) by mouth 3 (three) times daily as needed for spasms.  . Na Sulfate-K Sulfate-Mg Sulf 17.5-3.13-1.6 GM/180ML SOLN Suprep-Use as directed  . [DISCONTINUED] predniSONE (DELTASONE) 10 MG tablet Tapers as follows:  4-4-3-3-2-2   No facility-administered encounter medications on file as of 02/09/2016.    No Known Allergies Patient Active Problem List   Diagnosis Date Noted  . Chronic insomnia 05/01/2012  . HYPOKALEMIA 12/05/2009  . Fatigue 12/05/2009  . MIGRAINE HEADACHE 11/27/2009  . Dyslipidemia 10/09/2006  . 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 GERD 10/09/2006   Social History   Social History  . Marital status: Married    Spouse name: N/A  .  Number of children: 2  . Years of education: N/A   Occupational History  . RN    Social History Main Topics  . Smoking status: Former Smoker    Packs/day: 0.50    Years: 6.00    Types: Cigarettes  . Smokeless tobacco: Never Used  . Alcohol use No     Comment: soically  . Drug use: No  . Sexual activity: Yes    Birth control/ protection: Surgical     Comment: hysterectomy   Other Topics Concern  . Not on file    Social History Narrative   ** Merged History Encounter **        Laura Henson's family history includes Cancer (age of onset: 33) in her father; Colon cancer in her maternal grandfather; Diverticulitis in her brother and brother; Heart disease in her father; Heart disease (age of onset: 16) in her mother; Hypertension in her father and mother.      Objective:    Vitals:   02/09/16 1347  BP: 130/72  Pulse: 80    Physical Exam  well-developed thin white female in no acute distress, blood pressure 130/72 pulse 80, height 5 foot 11, patient declined weight. HEENT; nontraumatic normocephalic EOMI PERRLA sclera anicteric, Cardiovascular; regular rate and rhythm with S1-S2 no murmur or gallop, Pulmonary; clear bilaterally, Abdomen ;soft, she's tender in bilateral lower quadrants left greater than right,  no guarding or rebound no palpable mass or hepatosplenomegaly bowel sounds present, Rectal; exam not done, Ext; no clubbing cyanosis or edema skin warm and dry, Neuropsych; mood and affect appropriate       Assessment & Plan:   #68 41 year old white female with 2 month history of left-sided abdominal pain cramping, loose stools and intermittent rectal bleeding with bright red blood mixed with her stools. She also had an acute episode about 3 days ago insistent with segmental ischemic colitis with acute abdominal pain bloating diaphoresis, then passage of diarrhea and blood. We will need to rule out underlying IBD, or occult lesion #2 status post remote left hemicolectomy for endometriosis #3 status post hysterectomy #4 status post appendectomy #5 status post cholecystectomy   #6 previous history of eating disorder #7 history of IBS/constipation, has been on Linzessand does not want to stop as she feels worse if she doesn't have bowel movements  Plan; Refill Zofran 4 mg by mouth every 6 hours when necessary for nausea Okay to continue Linzess 145 g by mouth daily short-term until  colonoscopy is done Add Bentyl 10 mg by mouth 3 times a day when necessary for cramping and pain CBC with differential, sedimentation rate and CRP Patiently scheduled for colonoscopy with Dr. Myrtie Neither. Procedure was discussed in detail with patient including risks and benefits and she is agreeable to proceed. (Patient will be established with Dr. Myrtie Neither, Dr. Christella Hartigan was supervising but had  no procedure availability for several weeks to accommodate patient who is very anxious to have workup done as quickly as possible.)  Amy Oswald Hillock PA-C 02/09/2016   Cc: Kristian Covey, MD   Thank you for sending this case to me. I have reviewed the entire note, and the outlined plan seems appropriate.

## 2016-02-09 NOTE — Patient Instructions (Signed)
You have been scheduled for a colonoscopy. Please follow written instructions given to you at your visit today.  Please pick up your prep supplies at the pharmacy within the next 1-3 days. If you use inhalers (even only as needed), please bring them with you on the day of your procedure. Your physician has requested that you go to www.startemmi.com and enter the access code given to you at your visit today. This web site gives a general overview about your procedure. However, you should still follow specific instructions given to you by our office regarding your preparation for the procedure.  We have sent the following medications to your pharmacy for you to pick up at your convenience: zofran 4 mg every 6 hours as needed for nausea Bentyl 10 mg 1 tablet three times daily as needed for abdominal cramping and pain  Your physician has requested that you go to the basement for the following lab work before leaving today: CBC, CMET, Sed rate, CRP  If you are age 42 or older, your body mass index should be between 23-30. Your There is no height or weight on file to calculate BMI. If this is out of the aforementioned range listed, please consider follow up with your Primary Care Provider.  If you are age 42 or younger, your body mass index should be between 19-25. Your There is no height or weight on file to calculate BMI. If this is out of the aformentioned range listed, please consider follow up with your Primary Care Provider.

## 2016-02-14 ENCOUNTER — Encounter: Payer: Self-pay | Admitting: Gastroenterology

## 2016-02-14 ENCOUNTER — Ambulatory Visit (AMBULATORY_SURGERY_CENTER): Payer: 59 | Admitting: Gastroenterology

## 2016-02-14 VITALS — BP 119/80 | HR 60 | Temp 98.6°F | Resp 13 | Ht 71.0 in | Wt 139.0 lb

## 2016-02-14 DIAGNOSIS — R197 Diarrhea, unspecified: Secondary | ICD-10-CM | POA: Diagnosis present

## 2016-02-14 DIAGNOSIS — K6289 Other specified diseases of anus and rectum: Secondary | ICD-10-CM

## 2016-02-14 DIAGNOSIS — K921 Melena: Secondary | ICD-10-CM | POA: Diagnosis not present

## 2016-02-14 DIAGNOSIS — Z1211 Encounter for screening for malignant neoplasm of colon: Secondary | ICD-10-CM | POA: Diagnosis not present

## 2016-02-14 MED ORDER — SODIUM CHLORIDE 0.9 % IV SOLN: 500.0000 mL | INTRAVENOUS | Status: DC

## 2016-02-14 NOTE — Patient Instructions (Addendum)
YOU HAD AN ENDOSCOPIC PROCEDURE TODAY AT THE [facility]:   Refer to the procedure report that was given to you for any specific questions about what was found during the examination.  If the procedure report does not answer your questions, please call your gastroenterologist to clarify.  If you requested that your care partner not be given the details of your procedure findings, then the procedure report has been included in a sealed envelope for you to review at your convenience later.  YOU SHOULD EXPECT: Some feelings of bloating in the abdomen. Passage of more gas than usual.  Walking can help get rid of the air that was put into your GI tract during the procedure and reduce the bloating. If you had a lower endoscopy (such as a colonoscopy or flexible sigmoidoscopy) you may notice spotting of blood in your stool or on the toilet paper. If you underwent a bowel prep for your procedure, you may not have a normal bowel movement for a few days.  Please Note:  You might notice some irritation and congestion in your nose or some drainage.  This is from the oxygen used during your procedure.  There is no need for concern and it should clear up in a day or so.  SYMPTOMS TO REPORT IMMEDIATELY:   Following lower endoscopy (colonoscopy or flexible sigmoidoscopy):  Excessive amounts of blood in the stool  Significant tenderness or worsening of abdominal pains  Swelling of the abdomen that is new, acute  Fever of 100F or higher    For urgent or emergent issues, a gastroenterologist can be reached at any hour by calling [phone].   DIET:  We do recommend a small meal at first, but then you may proceed to your regular diet.  Drink plenty of fluids but you should avoid alcoholic beverages for 24 hours.  ACTIVITY:  You should plan to take it easy for the rest of today and you should NOT DRIVE or use heavy machinery until tomorrow (because of the sedation medicines used during the test).     FOLLOW UP: Our staff will call the number listed on your records the next business day following your procedure to check on you and address any questions or concerns that you may have regarding the information given to you following your procedure. If we do not reach you, we will leave a message.  However, if you are feeling well and you are not experiencing any problems, there is no need to return our call.  We will assume that you have returned to your regular daily activities without incident.  If any biopsies were taken you will be contacted by phone or by letter within the next 1-3 weeks.  Please call us at [phone] if you have not heard about the biopsies in 3 weeks.    SIGNATURES/CONFIDENTIALITY: You and/or your care partner have signed paperwork which will be entered into your electronic medical record.  These signatures attest to the fact that that the information above on your After Visit Summary has been reviewed and is understood.  Full responsibility of the confidentiality of this discharge information lies with you and/or your care-partner.   Discontinue dicyclomine,resume remainder of medication.per report consider a trial of miralax.

## 2016-02-14 NOTE — Op Note (Signed)
[facility] Patient Name: Curly RimJill Arriaga Procedure Date: 02/14/2016 1:15 PM MRN: [id number] Endoscopist: Sherilyn CooterHenry L. Myrtie Neitheranis , MD Age: 41 Referring MD:  Date of Birth: 1975-05-29 Gender: Female Account #: [id number] Procedure:                Colonoscopy Indications:              Lower abdominal pain, Diarrhea, Rectal bleeding Medicines:                Monitored Anesthesia Care Procedure:                Pre-Anesthesia Assessment:                           - Prior to the procedure, a History and Physical                            was performed, and patient medications and                            allergies were reviewed. The patient's tolerance of                            previous anesthesia was also reviewed. The risks                            and benefits of the procedure and the sedation                            options and risks were discussed with the patient.                            All questions were answered, and informed consent                            was obtained. Prior Anticoagulants: The patient has                            taken no previous anticoagulant or antiplatelet                            agents. ASA Grade Assessment: II - A patient with                            mild systemic disease. After reviewing the risks                            and benefits, the patient was deemed in                            satisfactory condition to undergo the procedure.                           After obtaining informed consent, the colonoscope  was passed under direct vision. Throughout the                            procedure, the patient's blood pressure, pulse, and                            oxygen saturations were monitored continuously. The                            CF-[id number] was introduced through the anus and                            advanced to the the cecum, identified by                            appendiceal orifice and  ileocecal valve. The                            colonoscopy was performed without difficulty. The                            patient tolerated the procedure well. The quality                            of the bowel preparation was excellent. The                            ileocecal valve, appendiceal orifice, and rectum                            were photographed. The bowel preparation used was                            SUPREP. Scope In: 1:24:45 PM Scope Out: 1:33:04 PM Scope Withdrawal Time: 0 hours 6 minutes 19 seconds  Total Procedure Duration: 0 hours 8 minutes 19 seconds  Findings:                 The perianal and digital rectal examinations were                            normal.                           There was evidence of a prior end-to-end                            colo-colonic anastomosis in the proximal rectum.                            This was widely patent and was characterized by                            healthy appearing mucosa.  The sigmoid and descending colon had been                            surgically resected.                           The exam was otherwise without abnormality on                            direct and retroflexion views. Complications:            No immediate complications. Estimated Blood Loss:     Estimated blood loss: none. Impression:               - Patent end-to-end colo-colonic anastomosis,                            characterized by healthy appearing mucosa.                           - The examination was otherwise normal on direct                            and retroflexion views.                           - No specimens collected.                           No evidence of ischemic colitis.                           Constipation appears related to decreased motility                            from sigmoid resection. Linzess may not be                            achieving sufficient evacuation of solid  stool,                            leading to episodes of catharsis with vagal episode                            and benign anal bleeding. Recommendation:           - Patient has a contact number available for                            emergencies. The signs and symptoms of potential                            delayed complications were discussed with the                            patient. Return to normal activities tomorrow.  Written discharge instructions were provided to the                            patient.                           - Resume previous diet.                           - Continue present medications except dicyclomine.                            Consider a trial of miralax instead of Linzess.                           - Repeat colonoscopy in 10 years for screening                            purposes. Henry L. Myrtie Neither, MD 02/14/2016 1:43:09 PM This report has been signed electronically.

## 2016-02-14 NOTE — Progress Notes (Signed)
Pt's states no medical or surgical changes since previsit or office visit. 

## 2016-02-14 NOTE — Progress Notes (Signed)
A and O x3. Report to RN. Tolerated MAC anesthesia well.

## 2016-02-15 ENCOUNTER — Telehealth: Payer: Self-pay

## 2016-02-15 NOTE — Telephone Encounter (Signed)
  Follow up Call-  Call back number 02/14/2016  Post procedure Call Back phone  # [phone]- [phone]  Permission to leave phone message Yes  Some recent data might be hidden     Patient questions:  Do you have a fever, pain , or abdominal swelling? No. Pain Score  0 *  Have you tolerated food without any problems? Yes.    Have you been able to return to your normal activities? Yes.    Do you have any questions about your discharge instructions: Diet   No. Medications  No. Follow up visit  No.  Do you have questions or concerns about your Care? No.  Actions: * If pain score is 4 or above: No action needed, pain <4.

## 2016-02-17 ENCOUNTER — Other Ambulatory Visit: Payer: Self-pay | Admitting: Family Medicine

## 2016-02-20 ENCOUNTER — Encounter: Payer: Self-pay | Admitting: Family Medicine

## 2016-02-21 ENCOUNTER — Other Ambulatory Visit: Payer: Self-pay

## 2016-02-21 MED ORDER — TRAZODONE HCL 150 MG PO TABS: 150.0000 mg | ORAL_TABLET | Freq: Every evening | ORAL | 1 refills | Status: DC | PRN

## 2016-02-28 ENCOUNTER — Encounter: Payer: 59 | Admitting: Gastroenterology

## 2016-04-17 ENCOUNTER — Other Ambulatory Visit: Payer: Self-pay | Admitting: *Deleted

## 2016-04-17 NOTE — Telephone Encounter (Signed)
May refill once. 

## 2016-04-17 NOTE — Telephone Encounter (Signed)
[facility] - Refill request Trazodone 150 mg, #90,  Last refill 02/21/16, last office visit 07/11/15.  Okay to fill?

## 2016-04-18 MED ORDER — TRAZODONE HCL 150 MG PO TABS: 150.0000 mg | ORAL_TABLET | Freq: Every evening | ORAL | 0 refills | Status: DC | PRN

## 2016-04-18 NOTE — Telephone Encounter (Signed)
Rx done. 

## 2016-04-19 ENCOUNTER — Other Ambulatory Visit: Payer: Self-pay | Admitting: Osteopathic Medicine

## 2016-04-19 DIAGNOSIS — Z1231 Encounter for screening mammogram for malignant neoplasm of breast: Secondary | ICD-10-CM

## 2016-05-21 ENCOUNTER — Ambulatory Visit: Payer: 59 | Admitting: Osteopathic Medicine

## 2016-05-24 ENCOUNTER — Ambulatory Visit: Payer: 59

## 2016-06-05 ENCOUNTER — Ambulatory Visit (INDEPENDENT_AMBULATORY_CARE_PROVIDER_SITE_OTHER): Payer: 59 | Admitting: Osteopathic Medicine

## 2016-06-05 VITALS — BP 126/88 | HR 65 | Ht 69.0 in

## 2016-06-05 DIAGNOSIS — Z8249 Family history of ischemic heart disease and other diseases of the circulatory system: Secondary | ICD-10-CM | POA: Insufficient documentation

## 2016-06-05 DIAGNOSIS — M79641 Pain in right hand: Secondary | ICD-10-CM | POA: Diagnosis not present

## 2016-06-05 DIAGNOSIS — Z0000 Encounter for general adult medical examination without abnormal findings: Secondary | ICD-10-CM

## 2016-06-05 DIAGNOSIS — Z8639 Personal history of other endocrine, nutritional and metabolic disease: Secondary | ICD-10-CM | POA: Diagnosis not present

## 2016-06-05 DIAGNOSIS — Z789 Other specified health status: Secondary | ICD-10-CM | POA: Diagnosis not present

## 2016-06-05 DIAGNOSIS — M79642 Pain in left hand: Secondary | ICD-10-CM

## 2016-06-05 DIAGNOSIS — K589 Irritable bowel syndrome without diarrhea: Secondary | ICD-10-CM | POA: Diagnosis not present

## 2016-06-05 DIAGNOSIS — G4700 Insomnia, unspecified: Secondary | ICD-10-CM

## 2016-06-05 MED ORDER — LINACLOTIDE 145 MCG PO CAPS: 145.0000 ug | ORAL_CAPSULE | Freq: Every day | ORAL | 3 refills | Status: DC

## 2016-06-05 MED ORDER — TRAZODONE HCL 150 MG PO TABS: 150.0000 mg | ORAL_TABLET | Freq: Every evening | ORAL | 3 refills | Status: DC | PRN

## 2016-06-05 NOTE — Progress Notes (Signed)
HPI: Laura Henson is a 41 y.o. female  who presents to [facility] [place] today, 06/05/16,  for chief complaint of:  Chief Complaint  Patient presents with  . Establish Care    annual        Patient here for annual physical / wellness exam.  See preventive care reviewed as below.   Additional concerns today include:   No major medical issues, chronic condition stable on current medications.   Patient is strict vegan, takes multivitamin.  History of irritable bowel syndrome, controlled on Linzess  History of insomnia, works as [employer] fundraiser, changing shifts a lot, trazodone helps.  Family history of cardiac disease, father and brothers. Patient has been on pravastatin in the past but has stopped this.  Pain/swelling in hands/fingers, has been worked up multiple times for rheumatoid arthritis, no significant morning stiffness or other joint pain. Previous RA workup consistently negative   Past medical, surgical, social and family history reviewed: Patient Active Problem List   Diagnosis Date Noted  . Chronic insomnia 05/01/2012  . HYPOKALEMIA 12/05/2009  . Fatigue 12/05/2009  . MIGRAINE HEADACHE 11/27/2009  . Dyslipidemia 10/09/2006  . 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 GERD 10/09/2006   Past Surgical History:  Procedure Laterality Date  . ABDOMINAL HYSTERECTOMY     DUB after childbirth, endometriosis  . APPENDECTOMY  1996  . CESAREAN SECTION    . CESAREAN SECTION    . CHOLECYSTECTOMY    . COLON SURGERY     lsft hemicoloning-endometriosis  . hemoroidectomy     scar tissue  . OOPHORECTOMY    . TONSILLECTOMY     Social History  Substance Use Topics  . Smoking status: Former Smoker    Packs/day: 0.50    Years: 6.00    Types: Cigarettes  . Smokeless tobacco: Never Used  . Alcohol use No     Comment: soically   Family History  Problem Relation Age of Onset  . Heart disease Mother 45       MI  . Hypertension Mother   . Heart disease Father         in his 30s  . Cancer Father 60       lung  . Hypertension Father   . Diverticulitis Brother   . Colon cancer Maternal Grandfather   . Diverticulitis Brother   . Stomach cancer Neg Hx   . Rectal cancer Neg Hx   . Esophageal cancer Neg Hx   . Liver cancer Neg Hx      Current medication list and allergy/intolerance information reviewed:   Current Outpatient Prescriptions  Medication Sig Dispense Refill  . dicyclomine (BENTYL) 10 MG capsule Take 1 capsule (10 mg total) by mouth 3 (three) times daily as needed for spasms. 90 capsule 2  . linaclotide (LINZESS) 145 MCG CAPS capsule Take 1 capsule (145 mcg total) by mouth daily. 90 capsule 1  . Multiple Vitamins-Minerals (WOMENS MULTI VITAMIN & MINERAL PO) Take by mouth daily.      . ondansetron (ZOFRAN) 4 MG tablet Take 1 tablet (4 mg total) by mouth every 6 (six) hours as needed for nausea or vomiting. 40 tablet 0  . pravastatin (PRAVACHOL) 20 MG tablet Take 1 tablet (20 mg total) by mouth daily. (Patient not taking: Reported on 02/14/2016) 90 tablet 1  . traZODone (DESYREL) 150 MG tablet Take 1 tablet (150 mg total) by mouth at bedtime as needed. For sleep 30 tablet 0   Current Facility-Administered Medications  Medication Dose Route Frequency Provider Last Rate Last Dose  . 0.9 %  sodium chloride infusion  500 mL Intravenous Continuous Danis, Starr Lake III, MD       No Known Allergies    Review of Systems:  Constitutional:  No  fever, no chills, No recent illness, No unintentional weight changes. No significant fatigue.   HEENT: No  headache, no vision change, no hearing change, No sore throat, No  sinus pressure  Cardiac: No  chest pain, No  pressure, No palpitations, No  Orthopnea  Respiratory:  No  shortness of breath. No  Cough  Gastrointestinal: No  abdominal pain, No  nausea, No  vomiting,  No  blood in stool, No  diarrhea, No  constipation   Musculoskeletal: No new myalgia/arthralgia  Skin: No  Rash, No other  wounds/concerning lesions  Hem/Onc: No  easy bruising/bleeding, No  abnormal lymph node  Endocrine: No cold intolerance,  No heat intolerance  Neurologic: No  weakness, No  dizziness,  Psychiatric: No  concerns with depression, No  concerns with anxiety, No sleep problems, No mood problems  Exam:  BP 126/88   Pulse 65   Ht 5\' 9"  (1.753 m)   Constitutional: VS see above. General Appearance: alert, well-developed, well-nourished, NAD  Eyes: Normal lids and conjunctive, non-icteric sclera  Ears, Nose, Mouth, Throat: MMM, Normal external inspection ears/nares/mouth/lips/gums. TM normal bilaterally. Pharynx/tonsils no erythema, no exudate. Nasal mucosa normal.   Neck: No masses, trachea midline. No thyroid enlargement. No tenderness/mass appreciated. No lymphadenopathy  Respiratory: Normal respiratory effort. no wheeze, no rhonchi, no rales  Cardiovascular: S1/S2 normal, no murmur, no rub/gallop auscultated. RRR. No lower extremity edema.   Gastrointestinal: Nontender, no masses. No hepatomegaly, no splenomegaly. No hernia appreciated. Bowel sounds normal. Rectal exam deferred.   Musculoskeletal: Gait normal. No clubbing/cyanosis of digits.   Neurological: Normal balance/coordination. No tremor.   Skin: warm, dry, intact. No rash/ulcer.   Psychiatric: Normal judgment/insight. Normal mood and affect. Oriented x3.    Results for orders placed or performed in visit on 06/05/16 (from the past 72 hour(s))  CBC     Status: None   Collection Time: 06/05/16  4:00 PM  Result Value Ref Range   WBC 3.9 3.8 - 10.8 K/uL   RBC 4.51 3.80 - 5.10 MIL/uL   Hemoglobin 14.3 11.7 - 15.5 g/dL   HCT 16.1 09.6 - 04.5 %   MCV 94.5 80.0 - 100.0 fL   MCH 31.7 27.0 - 33.0 pg   MCHC 33.6 32.0 - 36.0 g/dL   RDW 40.9 81.1 - 91.4 %   Platelets 203 140 - 400 K/uL   MPV 10.7 7.5 - 12.5 fL  COMPLETE METABOLIC PANEL WITH GFR     Status: None   Collection Time: 06/05/16  4:00 PM  Result Value Ref Range    Sodium 140 135 - 146 mmol/L   Potassium 4.1 3.5 - 5.3 mmol/L   Chloride 104 98 - 110 mmol/L   CO2 27 20 - 31 mmol/L   Glucose, Bld 91 65 - 99 mg/dL   BUN 10 7 - 25 mg/dL   Creat 7.82 9.56 - 2.13 mg/dL   Total Bilirubin 0.7 0.2 - 1.2 mg/dL   Alkaline Phosphatase 64 33 - 115 U/L   AST 18 10 - 30 U/L   ALT 14 6 - 29 U/L   Total Protein 6.7 6.1 - 8.1 g/dL   Albumin 4.5 3.6 - 5.1 g/dL   Calcium 9.9 8.6 -  10.2 mg/dL   GFR, Est African American >89 >=60 mL/min   GFR, Est Non African American >89 >=60 mL/min  Lipid panel     Status: Abnormal   Collection Time: 06/05/16  4:00 PM  Result Value Ref Range   Cholesterol 224 (H) <200 mg/dL   Triglycerides 98 <696 mg/dL   HDL 87 >29 mg/dL   Total CHOL/HDL Ratio 2.6 <5.0 Ratio   VLDL 20 <30 mg/dL   LDL Cholesterol 528 (H) <100 mg/dL  TSH     Status: None   Collection Time: 06/05/16  4:00 PM  Result Value Ref Range   TSH 1.23 mIU/L    Comment:   Reference Range   > or = 20 Years  0.40-4.50   Pregnancy Range First trimester  0.26-2.66 Second trimester 0.55-2.73 Third trimester  0.43-2.91     T4, free     Status: None   Collection Time: 06/05/16  4:00 PM  Result Value Ref Range   Free T4 1.2 0.8 - 1.8 ng/dL  VITAMIN D 25 Hydroxy (Vit-D Deficiency, Fractures)     Status: None   Collection Time: 06/05/16  4:00 PM  Result Value Ref Range   Vit D, 25-Hydroxy 42 30 - 100 ng/mL    Comment: Vitamin D Status           25-OH Vitamin D        Deficiency                <20 ng/mL        Insufficiency         20 - 29 ng/mL        Optimal             > or = 30 ng/mL   For 25-OH Vitamin D testing on patients on D2-supplementation and patients for whom quantitation of D2 and D3 fractions is required, the [facility] 25-OH VIT D, (D2,D3), LC/MS/MS is recommended: order code [phone] (patients > 2 yrs).   Folate     Status: None   Collection Time: 06/05/16  4:00 PM  Result Value Ref Range   Folate >24.0 >5.4 ng/mL    Comment: Reference  Range >17 years:   Low: <3.4 ng/mL              Borderline: 3.4-5.4 ng/mL              Normal: >5.4 ng/mL     Zinc     Status: None (Preliminary result)   Collection Time: 06/05/16  4:00 PM  Result Value Ref Range   Zinc    Ferritin     Status: None   Collection Time: 06/05/16  4:00 PM  Result Value Ref Range   Ferritin 38 10 - 232 ng/mL     ASSESSMENT/PLAN:   Annual physical exam - Plan: CBC, COMPLETE METABOLIC PANEL WITH GFR, Lipid panel, TSH, T4, free  Irritable bowel syndrome, unspecified type - Plan: linaclotide (LINZESS) 145 MCG CAPS capsule  Insomnia, unspecified type - Plan: traZODone (DESYREL) 150 MG tablet  Pain in both hands - Trial NSAIDs as needed, consider follow-up if recurrence/worsening  Family history of heart attack  Vegan - Plan: CBC, VITAMIN D 25 Hydroxy (Vit-D Deficiency, Fractures), Vitamin B12, Folate, Zinc, Ferritin  History of hyperlipidemia - Lipids at this point okay, given overall risks factors no need for statin - Plan: Lipid panel   FEMALE PREVENTIVE CARE Updated 06/05/16   ANNUAL SCREENING/COUNSELING  Diet/Exercise - HEALTHY HABITS DISCUSSED  TO DECREASE CV RISK History  Smoking Status  . Former Smoker  . Packs/day: 0.50  . Years: 6.00  . Types: Cigarettes  Smokeless Tobacco  . Never Used   History  Alcohol Use No    Comment: soically   No flowsheet data found.  Domestic violence concerns - no  HTN SCREENING - SEE VITALS  SEXUAL HEALTH  Sexually active in the past year - Yes with female.  Need/want STI testing today? - no  Concerns about libido or pain with sex? - no  Plans for pregnancy? - s/p hysterectomy  INFECTIOUS DISEASE SCREENING  HIV - does not need  GC/CT - does not need  HepC - DOB [birth year] - does not need  TB - does not need  DISEASE SCREENING  Lipid - needs  DM2 - needs  Osteoporosis - women age 37+ - does not need  CANCER SCREENING  Cervical - does not need  Breast - needs  Lung -  does not need  Colon - does not need  ADULT VACCINATION  Influenza - annual vaccine recommended  Td - booster every 10 years   Zoster - option at 4450, yes at 60+   PCV13 - was not indicated  PPSV23 - was not indicated Immunization History  Administered Date(s) Administered  . Influenza Whole 11/22/2009, 12/01/2011  . Td 11/15/2002  . Tdap 01/14/2010     OTHER  Fall - exercise and Vit D age 37+ - does not need  Consider ASA - age 42-59 - does not need      Visit summary with medication list and pertinent instructions was printed for patient to review. All questions at time of visit were answered - patient instructed to contact office with any additional concerns. ER/RTC precautions were reviewed with the patient. Follow-up plan: Return in about 1 year (around 06/05/2017) for annual physical, sooner if needed.

## 2016-06-06 DIAGNOSIS — M79641 Pain in right hand: Secondary | ICD-10-CM | POA: Insufficient documentation

## 2016-06-06 DIAGNOSIS — K589 Irritable bowel syndrome without diarrhea: Secondary | ICD-10-CM | POA: Insufficient documentation

## 2016-06-06 DIAGNOSIS — M79642 Pain in left hand: Secondary | ICD-10-CM

## 2016-06-06 LAB — COMPLETE METABOLIC PANEL WITH GFR
ALBUMIN: 4.5 g/dL (ref 3.6–5.1)
ALT: 14 U/L (ref 6–29)
AST: 18 U/L (ref 10–30)
Alkaline Phosphatase: 64 U/L (ref 33–115)
BILIRUBIN TOTAL: 0.7 mg/dL (ref 0.2–1.2)
BUN: 10 mg/dL (ref 7–25)
CO2: 27 mmol/L (ref 20–31)
Calcium: 9.9 mg/dL (ref 8.6–10.2)
Chloride: 104 mmol/L (ref 98–110)
Creat: 0.78 mg/dL (ref 0.50–1.10)
GFR, Est African American: >89 mL/min (ref >=60)
GLUCOSE: 91 mg/dL (ref 65–99)
POTASSIUM: 4.1 mmol/L (ref 3.5–5.3)
SODIUM: 140 mmol/L (ref 135–146)
TOTAL PROTEIN: 6.7 g/dL (ref 6.1–8.1)

## 2016-06-06 LAB — LIPID PANEL
Cholesterol: 224 mg/dL — ABNORMAL HIGH (ref <200)
HDL: 87 mg/dL (ref >50)
LDL Cholesterol: 117 mg/dL — ABNORMAL HIGH (ref <100)
Total CHOL/HDL Ratio: 2.6 Ratio (ref <5.0)
Triglycerides: 98 mg/dL (ref <150)
VLDL: 20 mg/dL (ref <30)

## 2016-06-06 LAB — FERRITIN: Ferritin: 38 ng/mL (ref 10–232)

## 2016-06-06 LAB — VITAMIN D 25 HYDROXY (VIT D DEFICIENCY, FRACTURES): Vit D, 25-Hydroxy: 42 ng/mL (ref 30–100)

## 2016-06-06 LAB — CBC
HCT: 42.6 % (ref 35.0–45.0)
HEMOGLOBIN: 14.3 g/dL (ref 11.7–15.5)
MCH: 31.7 pg (ref 27.0–33.0)
MCHC: 33.6 g/dL (ref 32.0–36.0)
MCV: 94.5 fL (ref 80.0–100.0)
MPV: 10.7 fL (ref 7.5–12.5)
PLATELETS: 203 10*3/uL (ref 140–400)
RBC: 4.51 MIL/uL (ref 3.80–5.10)
RDW: 13.1 % (ref 11.0–15.0)
WBC: 3.9 10*3/uL (ref 3.8–10.8)

## 2016-06-06 LAB — FOLATE: Folate: >24 ng/mL (ref >5.4)

## 2016-06-06 LAB — TSH: TSH: 1.23 m[IU]/L

## 2016-06-06 LAB — T4, FREE: Free T4: 1.2 ng/dL (ref 0.8–1.8)

## 2016-06-07 LAB — VITAMIN B12: Vitamin B-12: 413 pg/mL (ref 200–1100)

## 2016-06-07 LAB — ZINC: Zinc: 77 ug/dL (ref 60–130)

## 2016-06-07 NOTE — Addendum Note (Signed)
Addended by: Deirdre PippinsALEXANDER, Malynn Lucy M on: 06/07/2016 12:14 PM   Modules accepted: Orders

## 2016-06-11 ENCOUNTER — Telehealth: Payer: Self-pay

## 2016-06-14 ENCOUNTER — Other Ambulatory Visit: Payer: Self-pay | Admitting: Family Medicine

## 2016-07-12 ENCOUNTER — Ambulatory Visit
Admission: RE | Admit: 2016-07-12 | Discharge: 2016-07-12 | Disposition: A | Discharge status: Home / Self Care | Payer: 59 | Source: Ambulatory Visit | Attending: Osteopathic Medicine | Admitting: Osteopathic Medicine

## 2016-07-12 DIAGNOSIS — Z1231 Encounter for screening mammogram for malignant neoplasm of breast: Secondary | ICD-10-CM

## 2016-07-15 ENCOUNTER — Other Ambulatory Visit: Payer: Self-pay | Admitting: Osteopathic Medicine

## 2016-07-15 DIAGNOSIS — R928 Other abnormal and inconclusive findings on diagnostic imaging of breast: Secondary | ICD-10-CM

## 2016-07-17 ENCOUNTER — Encounter: Payer: Self-pay | Admitting: Osteopathic Medicine

## 2016-07-22 ENCOUNTER — Ambulatory Visit: Admission: RE | Admit: 2016-07-22 | Payer: 59 | Source: Ambulatory Visit

## 2016-07-22 ENCOUNTER — Ambulatory Visit
Admission: RE | Admit: 2016-07-22 | Discharge: 2016-07-22 | Disposition: A | Discharge status: Home / Self Care | Payer: 59 | Source: Ambulatory Visit | Attending: Osteopathic Medicine | Admitting: Osteopathic Medicine

## 2016-07-22 DIAGNOSIS — R928 Other abnormal and inconclusive findings on diagnostic imaging of breast: Secondary | ICD-10-CM

## 2016-09-23 ENCOUNTER — Other Ambulatory Visit: Payer: Self-pay | Admitting: Family Medicine

## 2016-09-24 ENCOUNTER — Encounter: Payer: Self-pay | Admitting: Osteopathic Medicine

## 2016-09-24 ENCOUNTER — Other Ambulatory Visit: Payer: Self-pay

## 2016-09-24 DIAGNOSIS — G4700 Insomnia, unspecified: Secondary | ICD-10-CM

## 2016-09-24 MED ORDER — TRAZODONE HCL 150 MG PO TABS: 150.0000 mg | ORAL_TABLET | Freq: Every evening | ORAL | 0 refills | Status: DC | PRN

## 2016-12-19 ENCOUNTER — Other Ambulatory Visit: Payer: Self-pay | Admitting: *Deleted

## 2016-12-19 DIAGNOSIS — G4700 Insomnia, unspecified: Secondary | ICD-10-CM

## 2016-12-19 MED ORDER — TRAZODONE HCL 150 MG PO TABS: 150.0000 mg | ORAL_TABLET | Freq: Every evening | ORAL | 0 refills | Status: AC | PRN

## 2017-03-13 ENCOUNTER — Other Ambulatory Visit: Payer: Self-pay

## 2017-03-14 ENCOUNTER — Other Ambulatory Visit: Payer: Self-pay

## 2017-03-14 MED ORDER — TRAZODONE HCL 150 MG PO TABS: 150.0000 mg | ORAL_TABLET | Freq: Every evening | ORAL | 0 refills | Status: DC | PRN

## 2017-03-21 ENCOUNTER — Encounter: Payer: Self-pay | Admitting: Osteopathic Medicine

## 2017-03-24 ENCOUNTER — Other Ambulatory Visit: Payer: Self-pay | Admitting: Osteopathic Medicine

## 2017-03-24 DIAGNOSIS — K589 Irritable bowel syndrome without diarrhea: Secondary | ICD-10-CM

## 2017-03-24 MED ORDER — LINACLOTIDE 290 MCG PO CAPS: 290.0000 ug | ORAL_CAPSULE | Freq: Every day | ORAL | 3 refills | Status: AC

## 2017-03-24 NOTE — Progress Notes (Signed)
Increase linzess

## 2017-06-05 ENCOUNTER — Other Ambulatory Visit: Payer: Self-pay | Admitting: Osteopathic Medicine
